# Patient Record
Sex: Female | Born: 1935 | Race: White | Hispanic: No | Smoking: Never smoker
Health system: Southern US, Community
[De-identification: ages and names within clinical notes are randomized; demographics above are authoritative.]

## PROBLEM LIST (undated history)

## (undated) DIAGNOSIS — E785 Hyperlipidemia, unspecified: Secondary | ICD-10-CM

## (undated) DIAGNOSIS — N183 Chronic kidney disease, stage 3 unspecified: Secondary | ICD-10-CM

## (undated) DIAGNOSIS — I10 Essential (primary) hypertension: Secondary | ICD-10-CM

## (undated) DIAGNOSIS — I639 Cerebral infarction, unspecified: Secondary | ICD-10-CM

## (undated) DIAGNOSIS — E119 Type 2 diabetes mellitus without complications: Secondary | ICD-10-CM

## (undated) DIAGNOSIS — I4819 Other persistent atrial fibrillation: Secondary | ICD-10-CM

## (undated) DIAGNOSIS — E039 Hypothyroidism, unspecified: Secondary | ICD-10-CM

## (undated) HISTORY — DX: Chronic kidney disease, stage 3 unspecified: N18.30

## (undated) HISTORY — DX: Type 2 diabetes mellitus without complications: E11.9

## (undated) HISTORY — DX: Chronic kidney disease, stage 3 (moderate): N18.3

## (undated) HISTORY — DX: Cerebral infarction, unspecified: I63.9

## (undated) HISTORY — DX: Other persistent atrial fibrillation: I48.19

## (undated) HISTORY — PX: TONSILLECTOMY AND ADENOIDECTOMY: SHX28

---

## 1968-12-19 HISTORY — PX: BREAST EXCISIONAL BIOPSY: SUR124

## 2004-10-25 ENCOUNTER — Ambulatory Visit: Payer: Self-pay | Admitting: Unknown Physician Specialty

## 2005-07-18 ENCOUNTER — Ambulatory Visit: Payer: Self-pay | Admitting: Gastroenterology

## 2005-11-30 ENCOUNTER — Ambulatory Visit: Payer: Self-pay | Admitting: Unknown Physician Specialty

## 2006-12-28 ENCOUNTER — Ambulatory Visit: Payer: Self-pay | Admitting: Unknown Physician Specialty

## 2008-01-29 ENCOUNTER — Ambulatory Visit: Payer: Self-pay | Admitting: Unknown Physician Specialty

## 2008-08-18 ENCOUNTER — Ambulatory Visit: Payer: Self-pay | Admitting: Unknown Physician Specialty

## 2009-02-05 ENCOUNTER — Ambulatory Visit: Payer: Self-pay | Admitting: Unknown Physician Specialty

## 2010-02-09 ENCOUNTER — Ambulatory Visit: Payer: Self-pay | Admitting: Unknown Physician Specialty

## 2011-02-16 ENCOUNTER — Ambulatory Visit: Payer: Self-pay | Admitting: Unknown Physician Specialty

## 2012-03-27 ENCOUNTER — Ambulatory Visit: Payer: Self-pay | Admitting: Unknown Physician Specialty

## 2013-06-20 ENCOUNTER — Ambulatory Visit: Payer: Self-pay

## 2014-05-26 DIAGNOSIS — E785 Hyperlipidemia, unspecified: Secondary | ICD-10-CM | POA: Insufficient documentation

## 2014-05-26 DIAGNOSIS — I10 Essential (primary) hypertension: Secondary | ICD-10-CM | POA: Insufficient documentation

## 2014-05-26 DIAGNOSIS — R739 Hyperglycemia, unspecified: Secondary | ICD-10-CM | POA: Insufficient documentation

## 2014-05-26 DIAGNOSIS — E039 Hypothyroidism, unspecified: Secondary | ICD-10-CM | POA: Insufficient documentation

## 2014-06-23 ENCOUNTER — Ambulatory Visit: Payer: Self-pay | Admitting: Internal Medicine

## 2014-12-25 ENCOUNTER — Ambulatory Visit: Payer: Self-pay | Admitting: Ophthalmology

## 2014-12-25 LAB — POTASSIUM: Potassium: 3.8 mmol/L (ref 3.5–5.1)

## 2015-01-06 ENCOUNTER — Ambulatory Visit: Payer: Self-pay | Admitting: Ophthalmology

## 2015-02-26 DIAGNOSIS — E7800 Pure hypercholesterolemia, unspecified: Secondary | ICD-10-CM | POA: Insufficient documentation

## 2015-02-26 DIAGNOSIS — F419 Anxiety disorder, unspecified: Secondary | ICD-10-CM | POA: Insufficient documentation

## 2015-04-19 NOTE — Op Note (Signed)
PATIENT NAME:  Jill GainerUSTIN, [name] MR#:  [id number] DATE OF BIRTH:  Feb 01, 1923  DATE OF PROCEDURE:  01/06/2015  PREOPERATIVE DIAGNOSIS:  Nuclear sclerotic cataract of the left eye.   POSTOPERATIVE DIAGNOSIS:  Nuclear sclerotic cataract of the left eye.   OPERATIVE PROCEDURE:  Cataract extraction by phacoemulsification with implant of intraocular lens to left eye.   SURGEON:  Galen ManilaWilliam Makaylen Thieme, MD.   ANESTHESIA:  1. Managed anesthesia care.  2. Topical tetracaine drops followed by 2% Xylocaine jelly applied in the preoperative holding area.   COMPLICATIONS:  As the last bit of cortical material was being removed, a medium-sized rent formed in the posterior capsule. No vitreous presented into the anterior chamber. A sulcus lens was placed without further complication.   TECHNIQUE:   Stop and chop.  DESCRIPTION OF PROCEDURE:  The patient was examined and consented in the preoperative holding area where the aforementioned topical anesthesia was applied to the left eye and then brought back to the Operating Room where the left eye was prepped and draped in the usual sterile ophthalmic fashion and a lid speculum was placed. A paracentesis was created with the side port blade and the anterior chamber was filled with viscoelastic. A near clear corneal incision was performed with the steel keratome. A continuous curvilinear capsulorrhexis was performed with a cystotome followed by the capsulorrhexis forceps. Hydrodissection and hydrodelineation were carried out with BSS on a blunt cannula. The lens was removed in a stop and chop technique and the remaining cortical material was removed with the irrigation-aspiration handpiece. The capsular bag was inflated with viscoelastic and the Alcon MA60AC 21.0-diopter lens, serial number [id number] was placed in the capsular bag without complication. The remaining viscoelastic was removed from the eye with the irrigation-aspiration handpiece. The wounds were hydrated.  The anterior chamber was flushed with Miostat and the eye was inflated to physiologic pressure. 0.1 mL of cefuroxime concentration 10 mg/mL was placed in the anterior chamber. The wounds were found to be water tight. The eye was dressed with Vigamox. The patient was given protective glasses to wear throughout the day and a shield with which to sleep tonight. The patient was also given drops with which to begin a drop regimen today and will follow-up with me in one day.    ____________________________ Jerilee FieldWilliam L. Corey Caulfield, MD wlp:TT D: 01/06/2015 20:52:19 ET T: 01/06/2015 23:05:11 ET JOB#: [id number]  cc: Nagi Furio L. Zipporah Finamore, MD, <Dictator> Jerilee FieldWILLIAM L [place] Dohmen MD ELECTRONICALLY SIGNED 01/07/2015 16:10

## 2015-07-03 ENCOUNTER — Other Ambulatory Visit: Payer: Self-pay | Admitting: Internal Medicine

## 2015-07-03 DIAGNOSIS — Z1231 Encounter for screening mammogram for malignant neoplasm of breast: Secondary | ICD-10-CM

## 2015-07-07 ENCOUNTER — Other Ambulatory Visit: Payer: Self-pay | Admitting: Internal Medicine

## 2015-07-07 ENCOUNTER — Ambulatory Visit
Admission: RE | Admit: 2015-07-07 | Discharge: 2015-07-07 | Disposition: A | Discharge status: Home / Self Care | Payer: Medicare Other | Source: Ambulatory Visit | Attending: Internal Medicine | Admitting: Internal Medicine

## 2015-07-07 DIAGNOSIS — Z1231 Encounter for screening mammogram for malignant neoplasm of breast: Secondary | ICD-10-CM

## 2015-08-27 DIAGNOSIS — R7303 Prediabetes: Secondary | ICD-10-CM | POA: Insufficient documentation

## 2016-04-14 DIAGNOSIS — J302 Other seasonal allergic rhinitis: Secondary | ICD-10-CM | POA: Insufficient documentation

## 2016-06-02 ENCOUNTER — Other Ambulatory Visit: Payer: Self-pay | Admitting: Internal Medicine

## 2016-06-02 DIAGNOSIS — Z1231 Encounter for screening mammogram for malignant neoplasm of breast: Secondary | ICD-10-CM

## 2016-07-28 ENCOUNTER — Other Ambulatory Visit: Payer: Self-pay | Admitting: Internal Medicine

## 2016-07-28 ENCOUNTER — Ambulatory Visit
Admission: RE | Admit: 2016-07-28 | Discharge: 2016-07-28 | Disposition: A | Discharge status: Home / Self Care | Payer: Medicare Other | Source: Ambulatory Visit | Attending: Internal Medicine | Admitting: Internal Medicine

## 2016-07-28 DIAGNOSIS — Z1231 Encounter for screening mammogram for malignant neoplasm of breast: Secondary | ICD-10-CM | POA: Insufficient documentation

## 2017-01-11 DIAGNOSIS — N289 Disorder of kidney and ureter, unspecified: Secondary | ICD-10-CM | POA: Insufficient documentation

## 2017-04-12 DIAGNOSIS — N183 Chronic kidney disease, stage 3 unspecified: Secondary | ICD-10-CM | POA: Insufficient documentation

## 2017-06-23 ENCOUNTER — Other Ambulatory Visit: Payer: Self-pay | Admitting: Internal Medicine

## 2017-06-23 DIAGNOSIS — Z1231 Encounter for screening mammogram for malignant neoplasm of breast: Secondary | ICD-10-CM

## 2017-08-11 ENCOUNTER — Ambulatory Visit
Admission: RE | Admit: 2017-08-11 | Discharge: 2017-08-11 | Disposition: A | Discharge status: Home / Self Care | Payer: Medicare Other | Source: Ambulatory Visit | Attending: Internal Medicine | Admitting: Internal Medicine

## 2017-08-11 DIAGNOSIS — Z1231 Encounter for screening mammogram for malignant neoplasm of breast: Secondary | ICD-10-CM | POA: Insufficient documentation

## 2018-03-16 ENCOUNTER — Ambulatory Visit (INDEPENDENT_AMBULATORY_CARE_PROVIDER_SITE_OTHER): Payer: Medicare Other

## 2018-03-16 ENCOUNTER — Ambulatory Visit (INDEPENDENT_AMBULATORY_CARE_PROVIDER_SITE_OTHER): Payer: Medicare Other | Admitting: Podiatry

## 2018-03-16 DIAGNOSIS — M659 Synovitis and tenosynovitis, unspecified: Secondary | ICD-10-CM | POA: Diagnosis not present

## 2018-03-16 DIAGNOSIS — M779 Enthesopathy, unspecified: Secondary | ICD-10-CM

## 2018-03-16 DIAGNOSIS — M7751 Other enthesopathy of right foot: Secondary | ICD-10-CM

## 2018-03-21 NOTE — Progress Notes (Signed)
   Subjective:  84 year old female presenting today as a new patient with a chief complaint of intermittent sharp, pulling pain of the lateral and medial sides of the right foot that has been ongoing for several years. Walking and standing increase the pain. She has not done anything for treatment. Patient is here for further evaluation and treatment.   No past medical history on file.    Objective / Physical Exam:  General:  The patient is alert and oriented x3 in no acute distress. Dermatology:  Skin is warm, dry and supple bilateral lower extremities. Negative for open lesions or macerations. Vascular:  Palpable pedal pulses bilaterally. No edema or erythema noted. Capillary refill within normal limits. Neurological:  Epicritic and protective threshold grossly intact bilaterally.  Musculoskeletal Exam:  Pain on palpation to the anterior lateral medial aspects of the patient's right ankle as well as the right midfoot. Mild edema noted. Range of motion within normal limits to all pedal and ankle joints bilateral. Muscle strength 5/5 in all groups bilateral.   Radiographic Exam:  Degenerative changes to the joints of the foot consistent with DJD.     Assessment: #1 pain in right ankle #2 synovitis of right ankle - lateral  #3 right midfoot OA  Plan of Care:  #1 Patient was evaluated. X-Rays reviewed.  #2 injection of 0.5 mL Celestone Soluspan injected in the patient's right ankle. #3 Recommended OTC insoles from the [employer] in [place].  #4 Patient may benefit from custom molded orthotics in the future.  #5 Recommended OTC Aleve as needed.  #6 Return to clinic as needed.    Felecia ShellingBrent M. Kimmi Acocella, DPM [facility]  Dr. Felecia ShellingBrent M. Needham Biggins, DPM    [address]                                        [place], [place] [address]                Office [phone]) [phone]  Fax [phone]

## 2018-04-17 DIAGNOSIS — E538 Deficiency of other specified B group vitamins: Secondary | ICD-10-CM | POA: Insufficient documentation

## 2018-06-13 ENCOUNTER — Telehealth: Payer: Self-pay | Admitting: Podiatry

## 2018-06-13 NOTE — Telephone Encounter (Signed)
I called pt and spoke with Dtr, Debarah Crapelaudia and informed PowerSteps could be purchased at [employer].

## 2018-06-13 NOTE — Telephone Encounter (Signed)
Patient wants to know where she can go to get inserts. She knows we do it in house but Dr Logan BoresEvans stated she can go somewhere else in [place] to get them cheaper and she cant remember where he was talking about. Please call patient back at [phone]

## 2018-07-10 ENCOUNTER — Other Ambulatory Visit: Payer: Self-pay | Admitting: Internal Medicine

## 2018-07-10 DIAGNOSIS — Z1231 Encounter for screening mammogram for malignant neoplasm of breast: Secondary | ICD-10-CM

## 2018-08-31 ENCOUNTER — Ambulatory Visit
Admission: RE | Admit: 2018-08-31 | Discharge: 2018-08-31 | Disposition: A | Discharge status: Home / Self Care | Payer: Medicare Other | Source: Ambulatory Visit | Attending: Internal Medicine | Admitting: Internal Medicine

## 2018-08-31 DIAGNOSIS — Z1231 Encounter for screening mammogram for malignant neoplasm of breast: Secondary | ICD-10-CM | POA: Diagnosis not present

## 2018-09-11 ENCOUNTER — Emergency Department: Payer: Medicare Other

## 2018-09-11 ENCOUNTER — Observation Stay
Admission: EM | Admit: 2018-09-11 | Discharge: 2018-09-12 | Disposition: A | Discharge status: Home / Self Care | Payer: Medicare Other | Attending: Internal Medicine | Admitting: Internal Medicine

## 2018-09-11 ENCOUNTER — Other Ambulatory Visit: Payer: Self-pay

## 2018-09-11 ENCOUNTER — Inpatient Hospital Stay: Payer: Medicare Other

## 2018-09-11 ENCOUNTER — Encounter: Payer: Self-pay | Admitting: Emergency Medicine

## 2018-09-11 ENCOUNTER — Inpatient Hospital Stay
Admit: 2018-09-11 | Discharge: 2018-09-11 | Disposition: A | Discharge status: Home / Self Care | Payer: Medicare Other | Attending: Internal Medicine | Admitting: Internal Medicine

## 2018-09-11 DIAGNOSIS — E876 Hypokalemia: Secondary | ICD-10-CM | POA: Diagnosis not present

## 2018-09-11 DIAGNOSIS — Z7951 Long term (current) use of inhaled steroids: Secondary | ICD-10-CM | POA: Insufficient documentation

## 2018-09-11 DIAGNOSIS — R29702 NIHSS score 2: Secondary | ICD-10-CM | POA: Diagnosis not present

## 2018-09-11 DIAGNOSIS — F419 Anxiety disorder, unspecified: Secondary | ICD-10-CM | POA: Diagnosis not present

## 2018-09-11 DIAGNOSIS — Z886 Allergy status to analgesic agent status: Secondary | ICD-10-CM | POA: Diagnosis not present

## 2018-09-11 DIAGNOSIS — E785 Hyperlipidemia, unspecified: Secondary | ICD-10-CM | POA: Insufficient documentation

## 2018-09-11 DIAGNOSIS — I639 Cerebral infarction, unspecified: Secondary | ICD-10-CM | POA: Diagnosis present

## 2018-09-11 DIAGNOSIS — I4891 Unspecified atrial fibrillation: Secondary | ICD-10-CM | POA: Insufficient documentation

## 2018-09-11 DIAGNOSIS — R531 Weakness: Secondary | ICD-10-CM | POA: Diagnosis not present

## 2018-09-11 DIAGNOSIS — Z880 Allergy status to penicillin: Secondary | ICD-10-CM | POA: Diagnosis not present

## 2018-09-11 DIAGNOSIS — Z8249 Family history of ischemic heart disease and other diseases of the circulatory system: Secondary | ICD-10-CM | POA: Insufficient documentation

## 2018-09-11 DIAGNOSIS — E039 Hypothyroidism, unspecified: Secondary | ICD-10-CM | POA: Insufficient documentation

## 2018-09-11 DIAGNOSIS — R4701 Aphasia: Secondary | ICD-10-CM | POA: Insufficient documentation

## 2018-09-11 DIAGNOSIS — Z9849 Cataract extraction status, unspecified eye: Secondary | ICD-10-CM | POA: Diagnosis not present

## 2018-09-11 DIAGNOSIS — Z79899 Other long term (current) drug therapy: Secondary | ICD-10-CM | POA: Diagnosis not present

## 2018-09-11 DIAGNOSIS — N183 Chronic kidney disease, stage 3 (moderate): Secondary | ICD-10-CM | POA: Insufficient documentation

## 2018-09-11 DIAGNOSIS — Z885 Allergy status to narcotic agent status: Secondary | ICD-10-CM | POA: Insufficient documentation

## 2018-09-11 DIAGNOSIS — E1122 Type 2 diabetes mellitus with diabetic chronic kidney disease: Secondary | ICD-10-CM | POA: Diagnosis not present

## 2018-09-11 DIAGNOSIS — Z79890 Hormone replacement therapy (postmenopausal): Secondary | ICD-10-CM | POA: Insufficient documentation

## 2018-09-11 DIAGNOSIS — I129 Hypertensive chronic kidney disease with stage 1 through stage 4 chronic kidney disease, or unspecified chronic kidney disease: Secondary | ICD-10-CM | POA: Insufficient documentation

## 2018-09-11 HISTORY — DX: Hypothyroidism, unspecified: E03.9

## 2018-09-11 HISTORY — DX: Essential (primary) hypertension: I10

## 2018-09-11 HISTORY — DX: Hyperlipidemia, unspecified: E78.5

## 2018-09-11 LAB — DIFFERENTIAL
BASOS ABS: 0.1 10*3/uL (ref 0–0.1)
Basophils Relative: 1 %
EOS ABS: 0.1 10*3/uL (ref 0–0.7)
Eosinophils Relative: 1 %
LYMPHS ABS: 1.3 10*3/uL (ref 1.0–3.6)
Lymphocytes Relative: 15 %
MONOS PCT: 6 %
Monocytes Absolute: 0.5 10*3/uL (ref 0.2–0.9)
Neutro Abs: 7 10*3/uL — ABNORMAL HIGH (ref 1.4–6.5)
Neutrophils Relative %: 77 %

## 2018-09-11 LAB — PROTIME-INR
INR: 0.92
Prothrombin Time: 12.3 seconds (ref 11.4–15.2)

## 2018-09-11 LAB — COMPREHENSIVE METABOLIC PANEL
ALT: 14 U/L (ref 0–44)
AST: 25 U/L (ref 15–41)
Albumin: 4.2 g/dL (ref 3.5–5.0)
Alkaline Phosphatase: 59 U/L (ref 38–126)
Anion gap: 10 (ref 5–15)
BILIRUBIN TOTAL: 0.6 mg/dL (ref 0.3–1.2)
BUN: 15 mg/dL (ref 8–23)
CO2: 26 mmol/L (ref 22–32)
CREATININE: 0.91 mg/dL (ref 0.44–1.00)
Calcium: 9.3 mg/dL (ref 8.9–10.3)
Chloride: 101 mmol/L (ref 98–111)
GFR calc Af Amer: >60 mL/min (ref >60)
GFR calc non Af Amer: 53 mL/min — ABNORMAL LOW (ref >60)
GLUCOSE: 207 mg/dL — AB (ref 70–99)
POTASSIUM: 3.5 mmol/L (ref 3.5–5.1)
Sodium: 137 mmol/L (ref 135–145)
Total Protein: 7.6 g/dL (ref 6.5–8.1)

## 2018-09-11 LAB — CBC
HEMATOCRIT: 41.3 % (ref 35.0–47.0)
HEMOGLOBIN: 14.4 g/dL (ref 12.0–16.0)
MCH: 32.6 pg (ref 26.0–34.0)
MCHC: 34.9 g/dL (ref 32.0–36.0)
MCV: 93.4 fL (ref 80.0–100.0)
Platelets: 393 10*3/uL (ref 150–440)
RBC: 4.42 MIL/uL (ref 3.80–5.20)
RDW: 13.2 % (ref 11.5–14.5)
WBC: 9 10*3/uL (ref 3.6–11.0)

## 2018-09-11 LAB — GLUCOSE, CAPILLARY: GLUCOSE-CAPILLARY: 180 mg/dL — AB (ref 70–99)

## 2018-09-11 LAB — HEMOGLOBIN A1C
Hgb A1c MFr Bld: 6.5 % — ABNORMAL HIGH (ref 4.8–5.6)
Mean Plasma Glucose: 139.85 mg/dL

## 2018-09-11 LAB — APTT: APTT: 32 s (ref 24–36)

## 2018-09-11 LAB — TROPONIN I: Troponin I: <0.03 ng/mL (ref <0.03)

## 2018-09-11 MED ORDER — CALCIUM CITRATE-VITAMIN D 500-500 MG-UNIT PO CHEW
2.0000 | CHEWABLE_TABLET | Freq: Every day | ORAL | Status: DC
  Administered 2018-09-12: 2 via ORAL
  Filled 2018-09-11: qty 2

## 2018-09-11 MED ORDER — POTASSIUM CHLORIDE CRYS ER 20 MEQ PO TBCR
40.0000 meq | EXTENDED_RELEASE_TABLET | Freq: Once | ORAL | Status: AC
  Administered 2018-09-11: 18:00:00 40 meq via ORAL
  Filled 2018-09-11: qty 2

## 2018-09-11 MED ORDER — LORATADINE 10 MG PO TABS
10.0000 mg | ORAL_TABLET | Freq: Every day | ORAL | Status: DC
  Administered 2018-09-12: 10:00:00 10 mg via ORAL
  Filled 2018-09-11: qty 1

## 2018-09-11 MED ORDER — ASPIRIN EC 325 MG PO TBEC
325.0000 mg | DELAYED_RELEASE_TABLET | Freq: Every day | ORAL | Status: DC
  Administered 2018-09-12: 325 mg via ORAL
  Filled 2018-09-11: qty 1

## 2018-09-11 MED ORDER — FLUTICASONE PROPIONATE 50 MCG/ACT NA SUSP
2.0000 | Freq: Every day | NASAL | Status: DC
  Administered 2018-09-12: 10:00:00 2 via NASAL
  Filled 2018-09-11: qty 16

## 2018-09-11 MED ORDER — LEVOTHYROXINE SODIUM 50 MCG PO TABS
75.0000 ug | ORAL_TABLET | Freq: Every day | ORAL | Status: DC
  Administered 2018-09-12: 11:00:00 75 ug via ORAL
  Filled 2018-09-11: qty 1

## 2018-09-11 MED ORDER — MAGNESIUM SULFATE 2 GM/50ML IV SOLN
2.0000 g | Freq: Once | INTRAVENOUS | Status: AC
  Administered 2018-09-11: 18:00:00 2 g via INTRAVENOUS
  Filled 2018-09-11: qty 50

## 2018-09-11 MED ORDER — ENOXAPARIN SODIUM 40 MG/0.4ML ~~LOC~~ SOLN
40.0000 mg | SUBCUTANEOUS | Status: DC
  Administered 2018-09-11: 22:00:00 40 mg via SUBCUTANEOUS
  Filled 2018-09-11: qty 0.4

## 2018-09-11 MED ORDER — STROKE: EARLY STAGES OF RECOVERY BOOK
Freq: Once | Status: AC
  Administered 2018-09-11: 18:00:00

## 2018-09-11 MED ORDER — ASPIRIN 81 MG PO CHEW
324.0000 mg | CHEWABLE_TABLET | Freq: Once | ORAL | Status: AC
  Administered 2018-09-11: 324 mg via ORAL
  Filled 2018-09-11: qty 4

## 2018-09-11 MED ORDER — ROSUVASTATIN CALCIUM 20 MG PO TABS
20.0000 mg | ORAL_TABLET | Freq: Every day | ORAL | Status: DC
  Administered 2018-09-11 – 2018-09-12 (×2): 20 mg via ORAL
  Filled 2018-09-11 (×2): qty 1

## 2018-09-11 MED ORDER — BISOPROLOL-HYDROCHLOROTHIAZIDE 2.5-6.25 MG PO TABS
1.0000 | ORAL_TABLET | Freq: Every day | ORAL | Status: DC
  Administered 2018-09-12: 1 via ORAL
  Filled 2018-09-11: qty 1

## 2018-09-11 MED ORDER — LORAZEPAM 0.5 MG PO TABS: 0.5000 mg | ORAL_TABLET | Freq: Every evening | ORAL | Status: DC | PRN

## 2018-09-11 MED ORDER — IOHEXOL 350 MG/ML SOLN
100.0000 mL | Freq: Once | INTRAVENOUS | Status: AC | PRN
  Administered 2018-09-11: 100 mL via INTRAVENOUS

## 2018-09-11 NOTE — ED Notes (Signed)
Pt ambulatory to and from bathroom with assist of RN. Pt ambulates with a steady gait. ABCs intact. NAD.

## 2018-09-11 NOTE — ED Notes (Signed)
Pt taken to CT. ABCs intact. NAD. 

## 2018-09-11 NOTE — ED Notes (Signed)
Pt to floor with Dorian EDT on cardiac monitor. At time of leaving ED, pt alert & oriented x3. ABCs intact. VSS. NAD.

## 2018-09-11 NOTE — Consult Note (Signed)
Referring Physician: Scotty CourtStafford    Chief Complaint: Confusion  HPI: Jill Mitchell is an 84 y.o. female who was at baseline last evening when her daughter spoke with her.  They spoke this morning again and the patient seemed confused.  Could not operate the microwave.  Speech did not make sense.  Patient was brought in for evaluation at that time.  Initial NIHSS of 2. At baseline patient lives alone and performs all ADL's independently.    Date last known well: Date: 09/10/2018 Time last known well: Time: 19:00 tPA Given: No: Outside time window  Past Medical History:  Diagnosis Date  . Hyperlipidemia   . Hypertension   . Hypothyroid     Past Surgical History:  Procedure Laterality Date  . BREAST EXCISIONAL BIOPSY Bilateral 1970   benign    Family History  Problem Relation Age of Onset  . Breast cancer Sister [id number]  . Breast cancer Paternal Aunt 6260   Social History:  reports that she has never smoked. She does not have any smokeless tobacco history on file. She reports that she drank alcohol. She reports that she does not use drugs.  Allergies:  Allergies  Allergen Reactions  . Aspirin Other (See Comments)    Causes bruising  . Atorvastatin Nausea Only  . Cholestyramine Nausea Only  . Codeine Other (See Comments)  . Lovastatin Nausea Only    GI upset  . Niacin Other (See Comments)    ineffective  . Penicillins Other (See Comments)  . Pravastatin Nausea Only  . Ramipril Other (See Comments)  . Simvastatin Nausea Only    Medications: I have reviewed the patient's current medications. Prior to Admission:  Prior to Admission medications   Medication Sig Start Date End Date Taking? Authorizing Provider  amLODipine (NORVASC) 10 MG tablet Take 10 mg by mouth daily.  01/11/17  Yes [provider]  bisoprolol-hydrochlorothiazide (ZIAC) 2.5-6.25 MG tablet Take 1 tablet by mouth daily.  03/06/18 03/06/19 Yes [provider]  calcium citrate-vitamin D  (CITRACAL+D) 315-200 MG-UNIT tablet Take 2 tablets by mouth daily.    Yes [provider]  cyanocobalamin (,VITAMIN B-12,) 1000 MCG/ML injection Inject 1,000 mcg into the muscle every 30 (thirty) days.   Yes [provider]  fluticasone (FLONASE) 50 MCG/ACT nasal spray Place 2 sprays into both nostrils daily.   Yes [provider]  levothyroxine (SYNTHROID, LEVOTHROID) 75 MCG tablet Take 75 mcg by mouth daily.  01/11/17  Yes [provider]  loratadine (CLARITIN) 10 MG tablet Take 10 mg by mouth daily.   Yes [provider]  LORazepam (ATIVAN) 1 MG tablet Take 0.5 mg by mouth at bedtime as needed for anxiety.  05/22/17  Yes [provider]  rosuvastatin (CRESTOR) 10 MG tablet Take 10 mg by mouth daily.  10/21/16  Yes [provider]    ROS: History obtained from the patient  General ROS: negative for - chills, fatigue, fever, night sweats, weight gain or weight loss Psychological ROS: negative for - behavioral disorder, hallucinations, memory difficulties, mood swings or suicidal ideation Ophthalmic ROS: negative for - blurry vision, double vision, eye pain or loss of vision ENT ROS: negative for - epistaxis, nasal discharge, oral lesions, sore throat, tinnitus or vertigo Allergy and Immunology ROS: negative for - hives or itchy/watery eyes Hematological and Lymphatic ROS: negative for - bleeding problems, bruising or swollen lymph nodes Endocrine ROS: negative for - galactorrhea, hair pattern changes, polydipsia/polyuria or temperature intolerance Respiratory ROS: negative for -  cough, hemoptysis, shortness of breath or wheezing Cardiovascular ROS: negative for - chest pain, dyspnea on exertion, edema or irregular heartbeat Gastrointestinal ROS: negative for - abdominal pain, diarrhea, hematemesis, nausea/vomiting or stool incontinence Genito-Urinary ROS: negative for - dysuria, hematuria, incontinence or urinary  frequency/urgency Musculoskeletal ROS: negative for - joint swelling or muscular weakness Neurological ROS: as noted in HPI Dermatological ROS: negative for rash and skin lesion changes  Physical Examination: Blood pressure (!) 154/78, pulse 93, temperature 98.2 F (36.8 C), temperature source Oral, resp. rate 15, height 5\' 6"  (1.676 m), weight 56.2 kg, SpO2 96 %.  HEENT-  Normocephalic, no lesions, without obvious abnormality.  Normal external eye and conjunctiva.  Normal TM's bilaterally.  Normal auditory canals and external ears. Normal external nose, mucus membranes and septum.  Normal pharynx. Cardiovascular- S1, S2 normal, pulses palpable throughout   Lungs- chest clear, no wheezing, rales, normal symmetric air entry Abdomen- soft, non-tender; bowel sounds normal; no masses,  no organomegaly Extremities- no edema Lymph-no adenopathy palpable Musculoskeletal-no joint tenderness, deformity or swelling Skin-warm and dry, no hyperpigmentation, vitiligo, or suspicious lesions  Neurological Examination   Mental Status: Alert.  Some expressive aphasia noted with multiple paraphasic errors.  Comprehension appears intact. Able to follow commands without difficulty. Cranial Nerves: II: Discs flat bilaterally; Visual fields grossly normal, pupils equal, round, reactive to light and accommodation III,IV, VI: ptosis not present, extra-ocular motions intact bilaterally V,VII: smile symmetric, facial light touch sensation normal bilaterally VIII: hearing normal bilaterally IX,X: gag reflex present XI: bilateral shoulder shrug XII: midline tongue extension Motor: Right : Upper extremity   5/5    Left:     Upper extremity   5/5  Lower extremity   5/5     Lower extremity   5/5 Tone and bulk:normal tone throughout; no atrophy noted Sensory: Pinprick and light touch intact throughout, bilaterally Deep Tendon Reflexes: 2+ and symmetric with absent AJ's bilaterally Plantars: Right: mute   Left:  mute Cerebellar: Normal finger-to-nose and normal heel-to-shin testing bilaterally Gait: not tested due to safety concerns    Laboratory Studies:  Basic Metabolic Panel: No results for input(s): NA, K, CL, CO2, GLUCOSE, BUN, CREATININE, CALCIUM, MG, PHOS in the last 168 hours.  Liver Function Tests: No results for input(s): AST, ALT, ALKPHOS, BILITOT, PROT, ALBUMIN in the last 168 hours. No results for input(s): LIPASE, AMYLASE in the last 168 hours. No results for input(s): AMMONIA in the last 168 hours.  CBC: Recent Labs  Lab 09/11/18 1159  WBC 9.0  NEUTROABS 7.0*  HGB 14.4  HCT 41.3  MCV 93.4  PLT 393    Cardiac Enzymes: No results for input(s): CKTOTAL, CKMB, CKMBINDEX, TROPONINI in the last 168 hours.  BNP: Invalid input(s): POCBNP  CBG: Recent Labs  Lab 09/11/18 1227  GLUCAP 180*    Microbiology: No results found for this or any previous visit.  Coagulation Studies: Recent Labs    09/11/18 1159  LABPROT 12.3  INR 0.92    Urinalysis: No results for input(s): COLORURINE, LABSPEC, PHURINE, GLUCOSEU, HGBUR, BILIRUBINUR, KETONESUR, PROTEINUR, UROBILINOGEN, NITRITE, LEUKOCYTESUR in the last 168 hours.  Invalid input(s): APPERANCEUR  Lipid Panel: No results found for: CHOL, TRIG, HDL, CHOLHDL, VLDL, LDLCALC  HgbA1C: No results found for: HGBA1C  Urine Drug Screen:  No results found for: LABOPIA, COCAINSCRNUR, LABBENZ, AMPHETMU, THCU, LABBARB  Alcohol Level: No results for input(s): ETH in the last 168 hours.  Other results: EKG: atrial fibrillation, rate 88 bpm.  Imaging: Ct Head Wo  Contrast  Result Date: 09/11/2018 CLINICAL DATA:  Confusion and dysarthria EXAM: CT HEAD WITHOUT CONTRAST TECHNIQUE: Contiguous axial images were obtained from the base of the skull through the vertex without intravenous contrast. COMPARISON:  None. FINDINGS: Brain: There is mild diffuse atrophy. There is no intracranial mass, hemorrhage, extra-axial fluid collection, or  midline shift. There is focal decreased attenuation with appearance sulcal effacement at the junction of the left superficial posterior temporal, superficial lateral occipital, and inferior parietal lobes. This area is felt to represent a likely acute infarct. Elsewhere there is relatively mild patchy small vessel disease in the centra semiovale bilaterally. Vascular: There is a subtle hyperdense vessel involving a portion of the M2 segment of the left middle cerebral artery. There is no appreciable vascular calcification. Skull: Bony calvarium appears intact. Sinuses/Orbits: There is mucosal thickening in several ethmoid air cells. Other visualized paranasal sinuses are clear. Visualized orbits appear symmetric bilaterally. Evidence of previous cataract removal on the left. Other: Mastoid air cells are clear. IMPRESSION: 1. Evidence of acute infarct at the junction of the left occipital, temporal, and parietal lobes. In this area, there is focal decreased attenuation in localized sulcal effacement. 2. Area hyperdense vessel involving a portion of the M2 segment of the left middle cerebral artery. 3. Atrophy with mild periventricular small vessel disease. No hemorrhage. 4.  Mucosal thickening in several ethmoid air cells. These results were called by telephone at the time of interpretation on 09/11/2018 at 12:11 pm to Dr. Sharman Cheek , who verbally acknowledged these results. Electronically Signed   By: Bretta Bang III M.D.   On: 09/11/2018 12:12    Assessment: 84 y.o. female presenting with aphasia.  Head CT reviewed and shows an area of hypodensity in the left parieto-occipital junction concerning for infarct.  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  Etiology likely embolic with patient showing evidence of new onset afib on EKG.   Patient on no antiplatelet or anticoagulation prior to  admission.  Further work up recommended.    Stroke Risk Factors - atrial fibrillation, hyperlipidemia and hypertension  Plan: 1. HgbA1c, fasting lipid panel 2. MRI of the brain without contrast 3. PT consult, OT consult, Speech consult 4. Echocardiogram 5. Prophylactic therapy-ASA 300mg  rectally until passing of swallow screen when may take 325mg  daily po.  After 5-7 days would start anticoagulation with Eliquis.   6. NPO until RN stroke swallow screen 8. Telemetry monitoring 9. Frequent neuro checks   Thana Farr, MD Neurology [phone] 09/11/2018, 12:42 PM

## 2018-09-11 NOTE — ED Notes (Signed)
[place] at bedside.

## 2018-09-11 NOTE — ED Triage Notes (Signed)
Per patient's daughter, patient called her this morning and stated "something's not right". Patient lives alone and A&O x4 at baseline. Patient confused and having trouble finding words upon arrival to ED. Last known well at 7pm last night. No weakness noted.

## 2018-09-11 NOTE — Progress Notes (Signed)
CODE STROKE- PHARMACY COMMUNICATION  Time CODE STROKE called/page received:_0   Time response to CODE STROKE was made (in person or via phone): @ 1230 in person   Time Stroke Kit retrieved from [facility] (only if needed):N/A  Name of Provider/Nurse contacted:Olivia   Past Medical History:  Diagnosis Date  . Hyperlipidemia   . Hypertension   . Hypothyroid    Prior to Admission medications   Medication Sig Start Date End Date Taking? Authorizing Provider  amLODipine (NORVASC) 10 MG tablet Take 10 mg by mouth daily.  01/11/17  Yes [provider]  bisoprolol-hydrochlorothiazide (ZIAC) 2.5-6.25 MG tablet Take 1 tablet by mouth daily.  03/06/18 03/06/19 Yes [provider]  calcium citrate-vitamin D (CITRACAL+D) 315-200 MG-UNIT tablet Take 2 tablets by mouth daily.    Yes [provider]  cyanocobalamin (,VITAMIN B-12,) 1000 MCG/ML injection Inject 1,000 mcg into the muscle every 30 (thirty) days.   Yes [provider]  fluticasone (FLONASE) 50 MCG/ACT nasal spray Place 2 sprays into both nostrils daily.   Yes [provider]  levothyroxine (SYNTHROID, LEVOTHROID) 75 MCG tablet Take 75 mcg by mouth daily.  01/11/17  Yes [provider]  loratadine (CLARITIN) 10 MG tablet Take 10 mg by mouth daily.   Yes [provider]  LORazepam (ATIVAN) 1 MG tablet Take 0.5 mg by mouth at bedtime as needed for anxiety.  05/22/17  Yes [provider]  rosuvastatin (CRESTOR) 10 MG tablet Take 10 mg by mouth daily.  10/21/16  Yes [provider]  Calcium Carb-Cholecalciferol (CALCIUM 1000 + D PO) Take by mouth.  09/11/18  [provider]    Pernell Dupre, PharmD, BCPS Clinical Pharmacist 09/11/2018 12:45 PM

## 2018-09-11 NOTE — ED Notes (Signed)
Pt returned from CT. ABCS intact. NAD. Scotty CourtStafford MD at bedside

## 2018-09-11 NOTE — H&P (Signed)
[facility] [place] [facility]   PATIENT NAME: Jill Mitchell    MR#:  [id number]  DATE OF BIRTH:  1923/02/22  DATE OF ADMISSION:  09/11/2018  PRIMARY CARE PHYSICIAN: Leotis Shames, MD   REQUESTING/REFERRING PHYSICIAN: Dr Marga Hoots  CHIEF COMPLAINT:   Chief Complaint  Patient presents with  . Altered Mental Status    HISTORY OF PRESENT ILLNESS:  Jill Mitchell  is a 84 y.o. female brought in with not knowing where she was and not acting right.  She stated she could not do the microwave correctly this morning.  She called her daughter and could not get out the words and her words were slurring.  She lives alone.  Normally walks without a problem.  She states that the she does not feel weak one side versus the other.  In the ER, she was found to have an acute stroke in the left parietal region.  Patient woke up with the symptoms.  Currently 2:30 in the afternoon.  Not a candidate for TPA at this point.  Hospitalist services contacted for further evaluation.  On the monitor patient is in atrial fibrillation which is a new diagnosis for the patient.  PAST MEDICAL HISTORY:   Past Medical History:  Diagnosis Date  . Hyperlipidemia   . Hypertension   . Hypothyroid     PAST SURGICAL HISTORY:   Past Surgical History:  Procedure Laterality Date  . BREAST EXCISIONAL BIOPSY Bilateral 1970   benign  . TONSILLECTOMY AND ADENOIDECTOMY      SOCIAL HISTORY:   Social History   Tobacco Use  . Smoking status: Never Smoker  . Smokeless tobacco: Never Used  Substance Use Topics  . Alcohol use: Not Currently    FAMILY HISTORY:   Family History  Problem Relation Age of Onset  . Breast cancer Sister 12  . Breast cancer Paternal Aunt 16  . CAD Mother     DRUG ALLERGIES:   Allergies  Allergen Reactions  . Aspirin Other (See Comments)    Causes bruising  . Atorvastatin Nausea Only  . Cholestyramine Nausea Only  . Codeine Other (See  Comments)  . Lovastatin Nausea Only    GI upset  . Niacin Other (See Comments)    ineffective  . Penicillins Other (See Comments)  . Pravastatin Nausea Only  . Ramipril Other (See Comments)  . Simvastatin Nausea Only    REVIEW OF SYSTEMS:  CONSTITUTIONAL: No fever, fatigue or weakness.  EYES: No blurred or double vision.  EARS, NOSE, AND THROAT: No tinnitus or ear pain. No sore throat.  Positive for postnasal drip.  Positive for decreased hearing RESPIRATORY: No cough, shortness of breath, wheezing or hemoptysis.  CARDIOVASCULAR: No chest pain, orthopnea, edema.  GASTROINTESTINAL: No nausea, vomiting, diarrhea or abdominal pain. No blood in bowel movements GENITOURINARY: No dysuria, hematuria.  ENDOCRINE: No polyuria, nocturia.  Positive for hypothyroidism HEMATOLOGY: No anemia, easy bruising or bleeding SKIN: No rash or lesion. MUSCULOSKELETAL: No joint pain or arthritis.   NEUROLOGIC: No tingling, numbness, weakness.  PSYCHIATRY: No anxiety or depression.   MEDICATIONS AT HOME:   Prior to Admission medications   Medication Sig Start Date End Date Taking? Authorizing Provider  amLODipine (NORVASC) 10 MG tablet Take 10 mg by mouth daily.  01/11/17  Yes [provider]  bisoprolol-hydrochlorothiazide (ZIAC) 2.5-6.25 MG tablet Take 1 tablet by mouth daily.  03/06/18 03/06/19 Yes [provider]  calcium citrate-vitamin D (CITRACAL+D) 315-200 MG-UNIT tablet Take 2 tablets  by mouth daily.    Yes [provider]  cyanocobalamin (,VITAMIN B-12,) 1000 MCG/ML injection Inject 1,000 mcg into the muscle every 30 (thirty) days.   Yes [provider]  fluticasone (FLONASE) 50 MCG/ACT nasal spray Place 2 sprays into both nostrils daily.   Yes [provider]  levothyroxine (SYNTHROID, LEVOTHROID) 75 MCG tablet Take 75 mcg by mouth daily.  01/11/17  Yes [provider]  loratadine (CLARITIN) 10 MG tablet Take 10 mg by mouth daily.   Yes  [provider]  LORazepam (ATIVAN) 1 MG tablet Take 0.5 mg by mouth at bedtime as needed for anxiety.  05/22/17  Yes [provider]  rosuvastatin (CRESTOR) 10 MG tablet Take 10 mg by mouth daily.  10/21/16  Yes [provider]      VITAL SIGNS:  Blood pressure (!) 153/70, pulse (!) 51, temperature 98.2 F (36.8 C), temperature source Oral, resp. rate 18, height 5\' 6"  (1.676 m), weight 56.2 kg, SpO2 95 %. Heart rate on the monitor 88 PHYSICAL EXAMINATION:  GENERAL:  84 y.o.-year-old patient lying in the bed with no acute distress.  EYES: Pupils equal, round, reactive to light and accommodation. No scleral icterus. Extraocular muscles intact.  HEENT: Head atraumatic, normocephalic. Oropharynx and nasopharynx clear.  NECK:  Supple, no jugular venous distention. No thyroid enlargement, no tenderness.  LUNGS: Normal breath sounds bilaterally, no wheezing, rales,rhonchi or crepitation. No use of accessory muscles of respiration.  CARDIOVASCULAR: S1, S2 irregular regular. No murmurs, rubs, or gallops.  ABDOMEN: Soft, nontender, nondistended. Bowel sounds present. No organomegaly or mass.  EXTREMITIES: No pedal edema, cyanosis, or clubbing.  NEUROLOGIC: Patient has impaired hearing.  Other cranial nerves grossly intact. Muscle strength 5/5 in all extremities. Sensation intact. Gait not checked.  PSYCHIATRIC: The patient is alert and oriented x 3.  SKIN: No rash, lesion, or ulcer.   LABORATORY PANEL:   CBC Recent Labs  Lab 09/11/18 1159  WBC 9.0  HGB 14.4  HCT 41.3  PLT 393   ------------------------------------------------------------------------------------------------------------------  Chemistries  Recent Labs  Lab 09/11/18 1159  NA 137  K 3.5  CL 101  CO2 26  GLUCOSE 207*  BUN 15  CREATININE 0.91  CALCIUM 9.3  AST 25  ALT 14  ALKPHOS 59  BILITOT 0.6    ------------------------------------------------------------------------------------------------------------------  Cardiac Enzymes Recent Labs  Lab 09/11/18 1159  TROPONINI <0.03   ------------------------------------------------------------------------------------------------------------------  RADIOLOGY:  Ct Angio Head W Or Wo Contrast  Result Date: 09/11/2018 CLINICAL DATA:  Confusion and altered mental status. Trouble finding words. EXAM: CT ANGIOGRAPHY HEAD AND NECK CT PERFUSION BRAIN TECHNIQUE: Multidetector CT imaging of the head and neck was performed using the standard protocol during bolus administration of intravenous contrast. Multiplanar CT image reconstructions and MIPs were obtained to evaluate the vascular anatomy. Carotid stenosis measurements (when applicable) are obtained utilizing NASCET criteria, using the distal internal carotid diameter as the denominator. Multiphase CT imaging of the brain was performed following IV bolus contrast injection. Subsequent parametric perfusion maps were calculated using RAPID software. CONTRAST:  OMNIPAQUE IOHEXOL 350 MG/ML SOLN COMPARISON:  Head CT from earlier today FINDINGS: CTA NECK FINDINGS Aortic arch: Atherosclerotic plaque. Three vessel branching. No aneurysm or dissection. Right carotid system: Calcified plaque at the right ICA bulb with 50% stenosis on coronal reformats. No ulceration. No beading. Left carotid system: Moderate calcified plaque at the common carotid bifurcation without stenosis or ulceration. ICA tortuosity. Vertebral arteries: Proximal subclavian atherosclerosis without flow limiting stenosis. There is  plaque at the origin of the right vertebral artery with mild stenosis. Tortuous vertebral arteries without evidence of dissection or beading. Skeleton: Advanced degenerative disease. Other neck: No evidence of mass or inflammation. Upper chest: No acute finding. Review of the MIP images confirms the above findings  CTA HEAD FINDINGS Anterior circulation: Left M4 branch occlusion marked on series 7. The level of occlusion correlates with the size of infarct by CT. There is atherosclerotic plaque on the carotid siphons. Wide necked superior hypophyseal region aneurysm from the ICA on the left measuring 4 x 3 mm. Posterior circulation: Symmetric vertebral arteries. The vertebral and basilar arteries are smooth and widely patent. No branch occlusion or beading. Venous sinuses: Patent Anatomic variants: None significant Delayed phase: Not obtained in the emergent setting Review of the MIP images confirms the above findings CT Brain Perfusion Findings: CBF (<30%) Volume: 330mL-but infarct is present infarct in the left parietal region by CT. Perfusion (Tmax>6.0s) volume: 419mL-matching the infarct by CT A page has been placed to Dr. Thad Rangereynolds. IMPRESSION: 1. Completed 9 cc left parietal infarct which correlates well with a left M4 branch occlusion. 2. No causative flow limiting stenosis or embolic source identified. 3. 50% atheromatous narrowing of the right right ICA bulb. 4. 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 Electronically Signed   By: Marnee SpringJonathon  Watts M.D.   On: 09/11/2018 13:18   Ct Head Wo Contrast  Result Date: 09/11/2018 CLINICAL DATA:  Confusion and dysarthria EXAM: CT HEAD WITHOUT CONTRAST TECHNIQUE: Contiguous axial images were obtained from the base of the skull through the vertex without intravenous contrast. COMPARISON:  None. FINDINGS: Brain: There is mild diffuse atrophy. There is no intracranial mass, hemorrhage, extra-axial fluid collection, or midline shift. There is focal decreased attenuation with appearance sulcal effacement at the junction of the left superficial posterior temporal, superficial lateral occipital, and inferior parietal lobes. This area is felt to represent a likely acute infarct. Elsewhere there is relatively mild patchy small vessel disease in the centra semiovale bilaterally.  Vascular: There is a subtle hyperdense vessel involving a portion of the M2 segment of the left middle cerebral artery. There is no appreciable vascular calcification. Skull: Bony calvarium appears intact. Sinuses/Orbits: There is mucosal thickening in several ethmoid air cells. Other visualized paranasal sinuses are clear. Visualized orbits appear symmetric bilaterally. Evidence of previous cataract removal on the left. Other: Mastoid air cells are clear. IMPRESSION: 1. Evidence of acute infarct at the junction of the left occipital, temporal, and parietal lobes. In this area, there is focal decreased attenuation in localized sulcal effacement. 2. Area hyperdense vessel involving a portion of the M2 segment of the left middle cerebral artery. 3. Atrophy with mild periventricular small vessel disease. No hemorrhage. 4.  Mucosal thickening in several ethmoid air cells. These results were called by telephone at the time of interpretation on 09/11/2018 at 12:11 pm to Dr. Sharman CheekPHILLIP STAFFORD , who verbally acknowledged these results. Electronically Signed   By: Bretta BangWilliam  Woodruff III M.D.   On: 09/11/2018 12:12   Ct Angio Neck W Or Wo Contrast  Result Date: 09/11/2018 CLINICAL DATA:  Confusion and altered mental status. Trouble finding words. EXAM: CT ANGIOGRAPHY HEAD AND NECK CT PERFUSION BRAIN TECHNIQUE: Multidetector CT imaging of the head and neck was performed using the standard protocol during bolus administration of intravenous contrast. Multiplanar CT image reconstructions and MIPs were obtained to evaluate the vascular anatomy. Carotid stenosis measurements (when applicable) are obtained utilizing NASCET criteria, using the  distal internal carotid diameter as the denominator. Multiphase CT imaging of the brain was performed following IV bolus contrast injection. Subsequent parametric perfusion maps were calculated using RAPID software. CONTRAST:  OMNIPAQUE IOHEXOL 350 MG/ML SOLN COMPARISON:  Head CT from  earlier today FINDINGS: CTA NECK FINDINGS Aortic arch: Atherosclerotic plaque. Three vessel branching. No aneurysm or dissection. Right carotid system: Calcified plaque at the right ICA bulb with 50% stenosis on coronal reformats. No ulceration. No beading. Left carotid system: Moderate calcified plaque at the common carotid bifurcation without stenosis or ulceration. ICA tortuosity. Vertebral arteries: Proximal subclavian atherosclerosis without flow limiting stenosis. There is plaque at the origin of the right vertebral artery with mild stenosis. Tortuous vertebral arteries without evidence of dissection or beading. Skeleton: Advanced degenerative disease. Other neck: No evidence of mass or inflammation. Upper chest: No acute finding. Review of the MIP images confirms the above findings CTA HEAD FINDINGS Anterior circulation: Left M4 branch occlusion marked on series 7. The level of occlusion correlates with the size of infarct by CT. There is atherosclerotic plaque on the carotid siphons. Wide necked superior hypophyseal region aneurysm from the ICA on the left measuring 4 x 3 mm. Posterior circulation: Symmetric vertebral arteries. The vertebral and basilar arteries are smooth and widely patent. No branch occlusion or beading. Venous sinuses: Patent Anatomic variants: None significant Delayed phase: Not obtained in the emergent setting Review of the MIP images confirms the above findings CT Brain Perfusion Findings: CBF (<30%) Volume: 26mL-but infarct is present infarct in the left parietal region by CT. Perfusion (Tmax>6.0s) volume: 74mL-matching the infarct by CT A page has been placed to Dr. Thad Ranger. IMPRESSION: 1. Completed 9 cc left parietal infarct which correlates well with a left M4 branch occlusion. 2. No causative flow limiting stenosis or embolic source identified. 3. 50% atheromatous narrowing of the right right ICA bulb. 4. 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 Electronically Signed    By: Marnee Spring M.D.   On: 09/11/2018 13:18   Ct Cerebral Perfusion W Contrast  Result Date: 09/11/2018 CLINICAL DATA:  Confusion and altered mental status. Trouble finding words. EXAM: CT ANGIOGRAPHY HEAD AND NECK CT PERFUSION BRAIN TECHNIQUE: Multidetector CT imaging of the head and neck was performed using the standard protocol during bolus administration of intravenous contrast. Multiplanar CT image reconstructions and MIPs were obtained to evaluate the vascular anatomy. Carotid stenosis measurements (when applicable) are obtained utilizing NASCET criteria, using the distal internal carotid diameter as the denominator. Multiphase CT imaging of the brain was performed following IV bolus contrast injection. Subsequent parametric perfusion maps were calculated using RAPID software. CONTRAST:  OMNIPAQUE IOHEXOL 350 MG/ML SOLN COMPARISON:  Head CT from earlier today FINDINGS: CTA NECK FINDINGS Aortic arch: Atherosclerotic plaque. Three vessel branching. No aneurysm or dissection. Right carotid system: Calcified plaque at the right ICA bulb with 50% stenosis on coronal reformats. No ulceration. No beading. Left carotid system: Moderate calcified plaque at the common carotid bifurcation without stenosis or ulceration. ICA tortuosity. Vertebral arteries: Proximal subclavian atherosclerosis without flow limiting stenosis. There is plaque at the origin of the right vertebral artery with mild stenosis. Tortuous vertebral arteries without evidence of dissection or beading. Skeleton: Advanced degenerative disease. Other neck: No evidence of mass or inflammation. Upper chest: No acute finding. Review of the MIP images confirms the above findings CTA HEAD FINDINGS Anterior circulation: Left M4 branch occlusion marked on series 7. The level of occlusion correlates with the size of infarct  by CT. There is atherosclerotic plaque on the carotid siphons. Wide necked superior hypophyseal region aneurysm from the ICA on  the left measuring 4 x 3 mm. Posterior circulation: Symmetric vertebral arteries. The vertebral and basilar arteries are smooth and widely patent. No branch occlusion or beading. Venous sinuses: Patent Anatomic variants: None significant Delayed phase: Not obtained in the emergent setting Review of the MIP images confirms the above findings CT Brain Perfusion Findings: CBF (<30%) Volume: 33mL-but infarct is present infarct in the left parietal region by CT. Perfusion (Tmax>6.0s) volume: 51mL-matching the infarct by CT A page has been placed to Dr. Thad Ranger. IMPRESSION: 1. Completed 9 cc left parietal infarct which correlates well with a left M4 branch occlusion. 2. No causative flow limiting stenosis or embolic source identified. 3. 50% atheromatous narrowing of the right right ICA bulb. 4. 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 Electronically Signed   By: Marnee Spring M.D.   On: 09/11/2018 13:18    EKG:   Atrial fibrillation 88 bpm nonspecific ST-T wave changes  IMPRESSION AND PLAN:   1.  acute stroke left parietal area with atrial fibrillation.  Start aspirin 325 mg daily.  Neurology consultation.  Can consider anticoagulation prior to discharge.  Stroke work-up with MRI of the brain, carotid ultrasound and echocardiogram. 2.  Hypertension.  Continue Ziac.  Hold other antihypertensive medications 3.  Hyperlipidemia unspecified.  Check a lipid profile tomorrow.  Increase Crestor to 20 mg daily 4.  Hypothyroidism unspecified.  Continue levothyroxine 5.  Impaired fasting glucose.  Check a hemoglobin A1c.  Put on sliding scale 6.  Hypokalemia replace potassium   All the records are reviewed and case discussed with ED provider. Management plans discussed with the patient, family and they are in agreement.  CODE STATUS: full code  TOTAL TIME TAKING CARE OF THIS PATIENT: 50  minutes.    Alford Highland M.D on 09/11/2018 at 2:28 PM  Between 7am to 6pm - Pager - [phone]  After  6pm call admission pager [phone]  [facility]  [phone]  CC: Primary care physician; Leotis Shames, MD

## 2018-09-11 NOTE — ED Notes (Signed)
Code  Stroke  Called  To [phone] 

## 2018-09-11 NOTE — ED Notes (Addendum)
Pt assisted to and from bathroom by this RN, pt ambulates with steady gait. Pt and family at bedside aware of POC - MRI & admission -  and deny needs/complaints. Call bell within reach.

## 2018-09-11 NOTE — ED Notes (Signed)
Pt returned from CT with this RN on cardiac monitor and cont pulse ox. ABCs intact. VSS. NAD

## 2018-09-11 NOTE — Progress Notes (Signed)
Patient ID: Jill Mitchell, female   DOB: [birth year], 84 y.o.   MRN: [id number]  ACP discussion  Patient, daughter and son-in-law at the bedside  Diagnosis: Acute stroke in the left parietal area, atrial fibrillation, hypertension, hyperlipidemia, hypothyroidism, impaired fasting glucose, hypokalemia  CODE STATUS discussed.  Patient and family had a real difficult decision trying to figure out what they wanted to do in the emergency situation.  At this time they decided to go with full code.  I advised if they change their mind they can let the nursing staff know and they will let the medical team know.  Plan.  Starting aspirin for acute stroke.  Stroke work-up with MRI of the brain, carotid ultrasound and echocardiogram.  Will have to consider major anticoagulation prior to discharge for her atrial fibrillation.  Time spent on ACP discussion 17 minutes Dr. Alford Highlandichard Davisha Linthicum

## 2018-09-11 NOTE — ED Provider Notes (Signed)
[facility] Emergency Department Provider Note  ____________________________________________  Time seen: Approximately 12:01 PM  I have reviewed the triage vital signs and the nursing notes.   HISTORY  Chief Complaint Altered Mental Status  Level 5 Caveat: Portions of the History and Physical including HPI and review of systems are unable to be completely obtained due to patient being a poor historian due to confusion   HPI Jill Mitchell is a 84 y.o. female with a past medical history of hypothyroidism hypertension hyperlipidemia who comes to the ED due to confusion.  When daughter spoke to her last night at 7:00 PM the patient was in her usual state of health.  This morning, the patient called her daughter and reported that she fell off, but she was having trouble using the microwave.  Daughter reports that the patient was also having trouble finding words and speaking clearly.  Never had anything like this before.  No recent falls or trauma.  No recent infections.  Only other symptom is generalized weakness for the past week.   Patient reports that she woke up at 8:00 AM with these symptoms.  No known history of atrial fibrillation or carotid stenosis   Past Medical History:  Diagnosis Date  . Hyperlipidemia   . Hypertension   . Hypothyroid      Patient Active Problem List   Diagnosis Date Noted  . CKD (chronic kidney disease) stage 3, GFR 30-59 ml/min (HCC) 04/12/2017  . Renal insufficiency 01/11/2017  . Seasonal allergic rhinitis 04/14/2016  . Prediabetes 08/27/2015  . Chronic anxiety 02/26/2015  . Pure hypercholesterolemia 02/26/2015  . Benign essential hypertension 05/26/2014  . Hyperglycemia, unspecified 05/26/2014  . Hyperlipidemia, unspecified 05/26/2014  . Hypothyroidism, unspecified 05/26/2014     Past Surgical History:  Procedure Laterality Date  . BREAST EXCISIONAL BIOPSY Bilateral 1970   benign     Prior to Admission  medications   Medication Sig Start Date End Date Taking? Authorizing Provider  amLODipine (NORVASC) 10 MG tablet Take 10 mg by mouth daily.  01/11/17  Yes [provider]  bisoprolol-hydrochlorothiazide (ZIAC) 2.5-6.25 MG tablet Take 1 tablet by mouth daily.  03/06/18 03/06/19 Yes [provider]  calcium citrate-vitamin D (CITRACAL+D) 315-200 MG-UNIT tablet Take 2 tablets by mouth daily.    Yes [provider]  cyanocobalamin (,VITAMIN B-12,) 1000 MCG/ML injection Inject 1,000 mcg into the muscle every 30 (thirty) days.   Yes [provider]  fluticasone (FLONASE) 50 MCG/ACT nasal spray Place 2 sprays into both nostrils daily.   Yes [provider]  levothyroxine (SYNTHROID, LEVOTHROID) 75 MCG tablet Take 75 mcg by mouth daily.  01/11/17  Yes [provider]  loratadine (CLARITIN) 10 MG tablet Take 10 mg by mouth daily.   Yes [provider]  LORazepam (ATIVAN) 1 MG tablet Take 0.5 mg by mouth at bedtime as needed for anxiety.  05/22/17  Yes [provider]  rosuvastatin (CRESTOR) 10 MG tablet Take 10 mg by mouth daily.  10/21/16  Yes [provider]     Allergies Aspirin; Atorvastatin; Cholestyramine; Codeine; Lovastatin; Niacin; Penicillins; Pravastatin; Ramipril; and Simvastatin   Family History  Problem Relation Age of Onset  . Breast cancer Sister 46  . Breast cancer Paternal Aunt 36    Social History Social History   Tobacco Use  . Smoking status: Never Smoker  Substance Use Topics  . Alcohol use: Not Currently  . Drug use: Never    Review of Systems  Constitutional:   No fever or chills.  ENT:   No sore throat. No rhinorrhea. Cardiovascular:   No chest pain or syncope. Respiratory:   No dyspnea or cough. Gastrointestinal:   Negative for abdominal pain, vomiting and diarrhea.  Musculoskeletal:   Negative for focal pain or swelling All other systems reviewed and are negative except as documented  above in ROS and HPI.  ____________________________________________   PHYSICAL EXAM:  VITAL SIGNS: ED Triage Vitals  Enc Vitals Group     BP 09/11/18 1146 (!) 159/86     Pulse Rate 09/11/18 1146 93     Resp 09/11/18 1146 16     Temp 09/11/18 1146 98.2 F (36.8 C)     Temp Source 09/11/18 1146 Oral     SpO2 09/11/18 1146 96 %     Weight 09/11/18 1147 124 lb (56.2 kg)     Height 09/11/18 1147 5\' 6"  (1.676 m)     Head Circumference --      Peak Flow --      Pain Score 09/11/18 1147 0     Pain Loc --      Pain Edu? --      Excl. in GC? --     Vital signs reviewed, nursing assessments reviewed.   Constitutional:   Alert and oriented. Non-toxic appearance. Eyes:   Conjunctivae are normal. EOMI. PERRL. ENT      Head:   Normocephalic and atraumatic.      Nose:   No congestion/rhinnorhea.       Mouth/Throat:   MMM, no pharyngeal erythema. No peritonsillar mass.       Neck:   No meningismus. Full ROM. Hematological/Lymphatic/Immunilogical:   No cervical lymphadenopathy. Cardiovascular:   RRR. Symmetric bilateral radial and DP pulses.  No murmurs. Cap refill less than 2 seconds. Respiratory:   Normal respiratory effort without tachypnea/retractions. Breath sounds are clear and equal bilaterally. No wheezes/rales/rhonchi. Gastrointestinal:   Soft and nontender. Non distended. There is no CVA tenderness.  No rebound, rigidity, or guarding. Musculoskeletal:   Normal range of motion in all extremities. No joint effusions.  No lower extremity tenderness.  No edema. Neurologic:   Normal speech.  Language deficit with word finding and reading difficulty. Motor grossly intact. No pronator drift.  Normal heel-to-shin. NIH stroke scale 1 Skin:    Skin is warm, dry and intact. No rash noted.  No petechiae, purpura, or bullae.  ____________________________________________    LABS (pertinent positives/negatives) (all labs ordered are listed, but only abnormal results are displayed) Labs  Reviewed  DIFFERENTIAL - Abnormal; Notable for the following components:      Result Value   Neutro Abs 7.0 (*)    All other components within normal limits  COMPREHENSIVE METABOLIC PANEL - Abnormal; Notable for the following components:   Glucose, Bld 207 (*)    GFR calc non Af Amer 53 (*)    All other components within normal limits  GLUCOSE, CAPILLARY - Abnormal; Notable for the following components:   Glucose-Capillary 180 (*)    All other components within normal limits  PROTIME-INR  APTT  CBC  TROPONIN I  CBG MONITORING, ED   ____________________________________________   EKG  Interpreted by me Atrial fibrillation rate of 88, normal axis and intervals.  Normal QRS ST segments and T waves.  ____________________________________________    RADIOLOGY  Ct Angio Head W Or Wo Contrast  Result Date: 09/11/2018 CLINICAL DATA:  Confusion and altered mental status. Trouble finding words. EXAM: CT  ANGIOGRAPHY HEAD AND NECK CT PERFUSION BRAIN TECHNIQUE: Multidetector CT imaging of the head and neck was performed using the standard protocol during bolus administration of intravenous contrast. Multiplanar CT image reconstructions and MIPs were obtained to evaluate the vascular anatomy. Carotid stenosis measurements (when applicable) are obtained utilizing NASCET criteria, using the distal internal carotid diameter as the denominator. Multiphase CT imaging of the brain was performed following IV bolus contrast injection. Subsequent parametric perfusion maps were calculated using RAPID software. CONTRAST:  OMNIPAQUE IOHEXOL 350 MG/ML SOLN COMPARISON:  Head CT from earlier today FINDINGS: CTA NECK FINDINGS Aortic arch: Atherosclerotic plaque. Three vessel branching. No aneurysm or dissection. Right carotid system: Calcified plaque at the right ICA bulb with 50% stenosis on coronal reformats. No ulceration. No beading. Left carotid system: Moderate calcified plaque at the common carotid  bifurcation without stenosis or ulceration. ICA tortuosity. Vertebral arteries: Proximal subclavian atherosclerosis without flow limiting stenosis. There is plaque at the origin of the right vertebral artery with mild stenosis. Tortuous vertebral arteries without evidence of dissection or beading. Skeleton: Advanced degenerative disease. Other neck: No evidence of mass or inflammation. Upper chest: No acute finding. Review of the MIP images confirms the above findings CTA HEAD FINDINGS Anterior circulation: Left M4 branch occlusion marked on series 7. The level of occlusion correlates with the size of infarct by CT. There is atherosclerotic plaque on the carotid siphons. Wide necked superior hypophyseal region aneurysm from the ICA on the left measuring 4 x 3 mm. Posterior circulation: Symmetric vertebral arteries. The vertebral and basilar arteries are smooth and widely patent. No branch occlusion or beading. Venous sinuses: Patent Anatomic variants: None significant Delayed phase: Not obtained in the emergent setting Review of the MIP images confirms the above findings CT Brain Perfusion Findings: CBF (<30%) Volume: 45mL-but infarct is present infarct in the left parietal region by CT. Perfusion (Tmax>6.0s) volume: 71mL-matching the infarct by CT A page has been placed to Dr. Thad Ranger. IMPRESSION: 1. Completed 9 cc left parietal infarct which correlates well with a left M4 branch occlusion. 2. No causative flow limiting stenosis or embolic source identified. 3. 50% atheromatous narrowing of the right right ICA bulb. 4. 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 Electronically Signed   By: Marnee Spring M.D.   On: 09/11/2018 13:18   Ct Head Wo Contrast  Result Date: 09/11/2018 CLINICAL DATA:  Confusion and dysarthria EXAM: CT HEAD WITHOUT CONTRAST TECHNIQUE: Contiguous axial images were obtained from the base of the skull through the vertex without intravenous contrast. COMPARISON:  None. FINDINGS:  Brain: There is mild diffuse atrophy. There is no intracranial mass, hemorrhage, extra-axial fluid collection, or midline shift. There is focal decreased attenuation with appearance sulcal effacement at the junction of the left superficial posterior temporal, superficial lateral occipital, and inferior parietal lobes. This area is felt to represent a likely acute infarct. Elsewhere there is relatively mild patchy small vessel disease in the centra semiovale bilaterally. Vascular: There is a subtle hyperdense vessel involving a portion of the M2 segment of the left middle cerebral artery. There is no appreciable vascular calcification. Skull: Bony calvarium appears intact. Sinuses/Orbits: There is mucosal thickening in several ethmoid air cells. Other visualized paranasal sinuses are clear. Visualized orbits appear symmetric bilaterally. Evidence of previous cataract removal on the left. Other: Mastoid air cells are clear. IMPRESSION: 1. Evidence of acute infarct at the junction of the left occipital, temporal, and parietal lobes. In this area, there is focal decreased attenuation  in localized sulcal effacement. 2. Area hyperdense vessel involving a portion of the M2 segment of the left middle cerebral artery. 3. Atrophy with mild periventricular small vessel disease. No hemorrhage. 4.  Mucosal thickening in several ethmoid air cells. These results were called by telephone at the time of interpretation on 09/11/2018 at 12:11 pm to Dr. Sharman Cheek , who verbally acknowledged these results. Electronically Signed   By: Bretta Bang III M.D.   On: 09/11/2018 12:12   Ct Angio Neck W Or Wo Contrast  Result Date: 09/11/2018 CLINICAL DATA:  Confusion and altered mental status. Trouble finding words. EXAM: CT ANGIOGRAPHY HEAD AND NECK CT PERFUSION BRAIN TECHNIQUE: Multidetector CT imaging of the head and neck was performed using the standard protocol during bolus administration of intravenous contrast.  Multiplanar CT image reconstructions and MIPs were obtained to evaluate the vascular anatomy. Carotid stenosis measurements (when applicable) are obtained utilizing NASCET criteria, using the distal internal carotid diameter as the denominator. Multiphase CT imaging of the brain was performed following IV bolus contrast injection. Subsequent parametric perfusion maps were calculated using RAPID software. CONTRAST:  OMNIPAQUE IOHEXOL 350 MG/ML SOLN COMPARISON:  Head CT from earlier today FINDINGS: CTA NECK FINDINGS Aortic arch: Atherosclerotic plaque. Three vessel branching. No aneurysm or dissection. Right carotid system: Calcified plaque at the right ICA bulb with 50% stenosis on coronal reformats. No ulceration. No beading. Left carotid system: Moderate calcified plaque at the common carotid bifurcation without stenosis or ulceration. ICA tortuosity. Vertebral arteries: Proximal subclavian atherosclerosis without flow limiting stenosis. There is plaque at the origin of the right vertebral artery with mild stenosis. Tortuous vertebral arteries without evidence of dissection or beading. Skeleton: Advanced degenerative disease. Other neck: No evidence of mass or inflammation. Upper chest: No acute finding. Review of the MIP images confirms the above findings CTA HEAD FINDINGS Anterior circulation: Left M4 branch occlusion marked on series 7. The level of occlusion correlates with the size of infarct by CT. There is atherosclerotic plaque on the carotid siphons. Wide necked superior hypophyseal region aneurysm from the ICA on the left measuring 4 x 3 mm. Posterior circulation: Symmetric vertebral arteries. The vertebral and basilar arteries are smooth and widely patent. No branch occlusion or beading. Venous sinuses: Patent Anatomic variants: None significant Delayed phase: Not obtained in the emergent setting Review of the MIP images confirms the above findings CT Brain Perfusion Findings: CBF (<30%) Volume:  22mL-but infarct is present infarct in the left parietal region by CT. Perfusion (Tmax>6.0s) volume: 65mL-matching the infarct by CT A page has been placed to Dr. Thad Ranger. IMPRESSION: 1. Completed 9 cc left parietal infarct which correlates well with a left M4 branch occlusion. 2. No causative flow limiting stenosis or embolic source identified. 3. 50% atheromatous narrowing of the right right ICA bulb. 4. 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 Electronically Signed   By: Marnee Spring M.D.   On: 09/11/2018 13:18   Ct Cerebral Perfusion W Contrast  Result Date: 09/11/2018 CLINICAL DATA:  Confusion and altered mental status. Trouble finding words. EXAM: CT ANGIOGRAPHY HEAD AND NECK CT PERFUSION BRAIN TECHNIQUE: Multidetector CT imaging of the head and neck was performed using the standard protocol during bolus administration of intravenous contrast. Multiplanar CT image reconstructions and MIPs were obtained to evaluate the vascular anatomy. Carotid stenosis measurements (when applicable) are obtained utilizing NASCET criteria, using the distal internal carotid diameter as the denominator. Multiphase CT imaging of the brain was performed following IV  bolus contrast injection. Subsequent parametric perfusion maps were calculated using RAPID software. CONTRAST:  OMNIPAQUE IOHEXOL 350 MG/ML SOLN COMPARISON:  Head CT from earlier today FINDINGS: CTA NECK FINDINGS Aortic arch: Atherosclerotic plaque. Three vessel branching. No aneurysm or dissection. Right carotid system: Calcified plaque at the right ICA bulb with 50% stenosis on coronal reformats. No ulceration. No beading. Left carotid system: Moderate calcified plaque at the common carotid bifurcation without stenosis or ulceration. ICA tortuosity. Vertebral arteries: Proximal subclavian atherosclerosis without flow limiting stenosis. There is plaque at the origin of the right vertebral artery with mild stenosis. Tortuous vertebral arteries  without evidence of dissection or beading. Skeleton: Advanced degenerative disease. Other neck: No evidence of mass or inflammation. Upper chest: No acute finding. Review of the MIP images confirms the above findings CTA HEAD FINDINGS Anterior circulation: Left M4 branch occlusion marked on series 7. The level of occlusion correlates with the size of infarct by CT. There is atherosclerotic plaque on the carotid siphons. Wide necked superior hypophyseal region aneurysm from the ICA on the left measuring 4 x 3 mm. Posterior circulation: Symmetric vertebral arteries. The vertebral and basilar arteries are smooth and widely patent. No branch occlusion or beading. Venous sinuses: Patent Anatomic variants: None significant Delayed phase: Not obtained in the emergent setting Review of the MIP images confirms the above findings CT Brain Perfusion Findings: CBF (<30%) Volume: 65mL-but infarct is present infarct in the left parietal region by CT. Perfusion (Tmax>6.0s) volume: 48mL-matching the infarct by CT A page has been placed to Dr. Thad Ranger. IMPRESSION: 1. Completed 9 cc left parietal infarct which correlates well with a left M4 branch occlusion. 2. No causative flow limiting stenosis or embolic source identified. 3. 50% atheromatous narrowing of the right right ICA bulb. 4. 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 Electronically Signed   By: Marnee Spring M.D.   On: 09/11/2018 13:18    ____________________________________________   PROCEDURES .Critical Care Performed by: Sharman Cheek, MD Authorized by: Sharman Cheek, MD   Critical care provider statement:    Critical care time (minutes):  30   Critical care time was exclusive of:  Separately billable procedures and treating other patients   Critical care was necessary to treat or prevent imminent or life-threatening deterioration of the following conditions:  CNS failure or compromise   Critical care was time spent personally by me on  the following activities:  Development of treatment plan with patient or surrogate, discussions with consultants, evaluation of patient's response to treatment, examination of patient, obtaining history from patient or surrogate, ordering and performing treatments and interventions, ordering and review of laboratory studies, ordering and review of radiographic studies, pulse oximetry, re-evaluation of patient's condition and review of old charts    ____________________________________________  DIFFERENTIAL DIAGNOSIS   Acute ischemic stroke, urinary tract infection, pneumonia, dehydration, electrolyte abnormality.  Doubt hemorrhagic stroke, sepsis, glaucoma, pseudotumor, temporal arteritis, skin or soft tissue infection  CLINICAL IMPRESSION / ASSESSMENT AND PLAN / ED COURSE  Pertinent labs & imaging results that were available during my care of the patient were reviewed by me and considered in my medical decision making (see chart for details).      Clinical Course as of Sep 11 1350  Tue Sep 11, 2018  1159 Saw pt briefly while preparing to go straight to CT. Awake, NAD. LKW 7:00pm last night.   [PS]  1235 Dr. Thad Ranger at bedside   [PS]  1256 D/w Dr. Thad Ranger, will proceed with  CTA/CTP to further assess. Will plan for admission and further stroke care and workup, pending need for transfer/intervention   [PS]  1345 CTA/CTP results discussed with Dr. Thad Rangereynolds.  Completed stroke, no role for intervention.  Oral aspirin, hospitalist admission.  Aspirin is listed as an allergy which causes "bruising", the benefits versus risk of which are favorable at this point given her A. fib on EKG and current stroke.   [PS]    Clinical Course User Index [PS] Sharman CheekStafford, Cassidi Modesitt, MD    ----------------------------------------- 1:51 PM on 09/11/2018 -----------------------------------------  Discussed with hospitalist for further management of acute stroke and new onset atrial fibrillation.  Oral  aspirin ordered.   ____________________________________________   FINAL CLINICAL IMPRESSION(S) / ED DIAGNOSES    Final diagnoses:  Acute ischemic stroke [facility](HCC)  New onset atrial fibrillation [facility](HCC)     ED Discharge Orders    None      Portions of this note were generated with dragon dictation software. Dictation errors may occur despite best attempts at proofreading.    Sharman CheekStafford, Alesa Echevarria, MD 09/11/18 1352

## 2018-09-11 NOTE — Progress Notes (Signed)
Chaplain responded to a code stroke. Daughter and son in law were at the bedside. Chaplain practiced pastoral presence and silent prayer. Chaplain prayed for the healing of the patient and restoration. Family and Pt were anxious about diagnosis of a stroke. Chaplain offered another prayer for restoration.    09/11/18 1200  Clinical Encounter Type  Visited With Patient and family together  Visit Type Initial;Code  Referral From Care management  Spiritual Encounters  Spiritual Needs Prayer;Emotional

## 2018-09-11 NOTE — ED Notes (Signed)
Neuro MD at bedside giving pt and family an update

## 2018-09-11 NOTE — ED Notes (Signed)
Pt returned from US. ABCs intact. NAD.  

## 2018-09-11 NOTE — ED Notes (Signed)
Pt to US. ABCs intact. NAD. 

## 2018-09-11 NOTE — ED Notes (Signed)
Pt to CT on cardiac monitor and continuous pulse ox with this RN. ABCs intact. VSS. NAD

## 2018-09-12 ENCOUNTER — Encounter: Payer: Self-pay | Admitting: Physician Assistant

## 2018-09-12 DIAGNOSIS — Z794 Long term (current) use of insulin: Secondary | ICD-10-CM

## 2018-09-12 DIAGNOSIS — I639 Cerebral infarction, unspecified: Secondary | ICD-10-CM | POA: Diagnosis not present

## 2018-09-12 DIAGNOSIS — I4891 Unspecified atrial fibrillation: Secondary | ICD-10-CM

## 2018-09-12 DIAGNOSIS — E118 Type 2 diabetes mellitus with unspecified complications: Secondary | ICD-10-CM | POA: Diagnosis not present

## 2018-09-12 DIAGNOSIS — I6310 Cerebral infarction due to embolism of unspecified precerebral artery: Secondary | ICD-10-CM

## 2018-09-12 DIAGNOSIS — I10 Essential (primary) hypertension: Secondary | ICD-10-CM

## 2018-09-12 LAB — LIPID PANEL
CHOL/HDL RATIO: 3.6 ratio
CHOLESTEROL: 182 mg/dL (ref 0–200)
HDL: 51 mg/dL (ref >40)
LDL Cholesterol: 110 mg/dL — ABNORMAL HIGH (ref 0–99)
Triglycerides: 106 mg/dL (ref <150)
VLDL: 21 mg/dL (ref 0–40)

## 2018-09-12 LAB — ECHOCARDIOGRAM COMPLETE
Height: 66 in
Weight: 1984 oz

## 2018-09-12 LAB — CBC
HCT: 39.6 % (ref 35.0–47.0)
HEMOGLOBIN: 13.5 g/dL (ref 12.0–16.0)
MCH: 31.8 pg (ref 26.0–34.0)
MCHC: 34 g/dL (ref 32.0–36.0)
MCV: 93.5 fL (ref 80.0–100.0)
Platelets: 376 10*3/uL (ref 150–440)
RBC: 4.23 MIL/uL (ref 3.80–5.20)
RDW: 12.9 % (ref 11.5–14.5)
WBC: 8.6 10*3/uL (ref 3.6–11.0)

## 2018-09-12 LAB — BASIC METABOLIC PANEL
Anion gap: 8 (ref 5–15)
BUN: 12 mg/dL (ref 8–23)
CHLORIDE: 105 mmol/L (ref 98–111)
CO2: 23 mmol/L (ref 22–32)
Calcium: 8.7 mg/dL — ABNORMAL LOW (ref 8.9–10.3)
Creatinine, Ser: 0.59 mg/dL (ref 0.44–1.00)
GFR calc Af Amer: >60 mL/min (ref >60)
GFR calc non Af Amer: >60 mL/min (ref >60)
GLUCOSE: 125 mg/dL — AB (ref 70–99)
POTASSIUM: 3.8 mmol/L (ref 3.5–5.1)
Sodium: 136 mmol/L (ref 135–145)

## 2018-09-12 LAB — MAGNESIUM: Magnesium: 2.6 mg/dL — ABNORMAL HIGH (ref 1.7–2.4)

## 2018-09-12 MED ORDER — ASPIRIN 325 MG PO TBEC: 325.0000 mg | DELAYED_RELEASE_TABLET | Freq: Every day | ORAL | 0 refills | Status: AC

## 2018-09-12 MED ORDER — APIXABAN 2.5 MG PO TABS: 2.5000 mg | ORAL_TABLET | Freq: Two times a day (BID) | ORAL | 1 refills | Status: DC

## 2018-09-12 MED ORDER — ROSUVASTATIN CALCIUM 20 MG PO TABS: 20.0000 mg | ORAL_TABLET | Freq: Every day | ORAL | 2 refills | Status: DC

## 2018-09-12 MED ORDER — BISOPROLOL-HYDROCHLOROTHIAZIDE 5-6.25 MG PO TABS: 1.0000 | ORAL_TABLET | Freq: Every day | ORAL | 2 refills | Status: DC

## 2018-09-12 MED ORDER — ONDANSETRON HCL 4 MG/2ML IJ SOLN
4.0000 mg | Freq: Four times a day (QID) | INTRAMUSCULAR | Status: DC | PRN
  Administered 2018-09-12: 08:00:00 4 mg via INTRAVENOUS
  Filled 2018-09-12: qty 2

## 2018-09-12 NOTE — Progress Notes (Signed)
Subjective: Patient is awake, alert and oriented x4, speech is much improved from previous. Daughter at bedside, she report that patient's speech is much better.  Objective: Current vital signs: BP 139/66 (BP Location: Left Arm)   Pulse 68   Temp 98.4 F (36.9 C) (Oral)   Resp 16   Ht 5\' 6"  (1.676 m)   Wt 56.2 kg   SpO2 94%   BMI 20.01 kg/m  Vital signs in last 24 hours: Temp:  [97.7 F (36.5 C)-98.4 F (36.9 C)] 98.4 F (36.9 C) (09/25 1610) Pulse Rate:  [51-100] 68 (09/25 0652) Resp:  [15-20] 16 (09/25 0652) BP: (122-159)/(58-91) 139/66 (09/25 0652) SpO2:  [93 %-96 %] 94 % (09/25 0652) Weight:  [56.2 kg] 56.2 kg (09/24 1147)  Intake/Output from previous day: 09/24 0701 - 09/25 0700 In: 240 [P.O.:240] Out: -  Intake/Output this shift: No intake/output data recorded. Nutritional status:  Diet Order            Diet Heart Room service appropriate? Yes; Fluid consistency: Thin  Diet effective now             Neurologic Exam: Mental Status: Alert.  Some difficulty with naming.  Comprehension appears intact. Able to follow commands without difficulty. Cranial Nerves: II: Discs flat bilaterally; Visual fields grossly normal, pupils equal, round, reactive to light and accommodation III,IV, VI: ptosis not present, extra-ocular motions intact bilaterally V,VII: smile symmetric, facial light touch sensation normal bilaterally VIII: hearing normal bilaterally IX,X: gag reflex present XI: bilateral shoulder shrug XII: midline tongue extension Motor: Right :  Upper extremity   5/5                                      Left:     Upper extremity   5/5             Lower extremity   5/5                                                  Lower extremity   5/5 Tone and bulk:normal tone throughout; no atrophy noted Sensory: Pinprick and light touch intact throughout, bilaterally  Lab Results: Basic Metabolic Panel: Recent Labs  Lab 09/11/18 1159 09/12/18 0426  NA 137 136  K  3.5 3.8  CL 101 105  CO2 26 23  GLUCOSE 207* 125*  BUN 15 12  CREATININE 0.91 0.59  CALCIUM 9.3 8.7*  MG  --  2.6*    Liver Function Tests: Recent Labs  Lab 09/11/18 1159  AST 25  ALT 14  ALKPHOS 59  BILITOT 0.6  PROT 7.6  ALBUMIN 4.2   No results for input(s): LIPASE, AMYLASE in the last 168 hours. No results for input(s): AMMONIA in the last 168 hours.  CBC: Recent Labs  Lab 09/11/18 1159 09/12/18 0426  WBC 9.0 8.6  NEUTROABS 7.0*  --   HGB 14.4 13.5  HCT 41.3 39.6  MCV 93.4 93.5  PLT 393 376    Cardiac Enzymes: Recent Labs  Lab 09/11/18 1159  TROPONINI <0.03    Lipid Panel: Recent Labs  Lab 09/12/18 0426  CHOL 182  TRIG 106  HDL 51  CHOLHDL 3.6  VLDL 21  LDLCALC 960*    CBG: Recent Labs  Lab 09/11/18 1227  GLUCAP 180*    Microbiology: No results found for this or any previous visit.  Coagulation Studies: Recent Labs    09/11/18 1159  LABPROT 12.3  INR 0.92    Imaging: Ct Angio Head W Or Wo Contrast  Result Date: 09/11/2018 CLINICAL DATA:  Confusion and altered mental status. Trouble finding words. EXAM: CT ANGIOGRAPHY HEAD AND NECK CT PERFUSION BRAIN TECHNIQUE: Multidetector CT imaging of the head and neck was performed using the standard protocol during bolus administration of intravenous contrast. Multiplanar CT image reconstructions and MIPs were obtained to evaluate the vascular anatomy. Carotid stenosis measurements (when applicable) are obtained utilizing NASCET criteria, using the distal internal carotid diameter as the denominator. Multiphase CT imaging of the brain was performed following IV bolus contrast injection. Subsequent parametric perfusion maps were calculated using RAPID software. CONTRAST:  OMNIPAQUE IOHEXOL 350 MG/ML SOLN COMPARISON:  Head CT from earlier today FINDINGS: CTA NECK FINDINGS Aortic arch: Atherosclerotic plaque. Three vessel branching. No aneurysm or dissection. Right carotid system: Calcified  plaque at the right ICA bulb with 50% stenosis on coronal reformats. No ulceration. No beading. Left carotid system: Moderate calcified plaque at the common carotid bifurcation without stenosis or ulceration. ICA tortuosity. Vertebral arteries: Proximal subclavian atherosclerosis without flow limiting stenosis. There is plaque at the origin of the right vertebral artery with mild stenosis. Tortuous vertebral arteries without evidence of dissection or beading. Skeleton: Advanced degenerative disease. Other neck: No evidence of mass or inflammation. Upper chest: No acute finding. Review of the MIP images confirms the above findings CTA HEAD FINDINGS Anterior circulation: Left M4 branch occlusion marked on series 7. The level of occlusion correlates with the size of infarct by CT. There is atherosclerotic plaque on the carotid siphons. Wide necked superior hypophyseal region aneurysm from the ICA on the left measuring 4 x 3 mm. Posterior circulation: Symmetric vertebral arteries. The vertebral and basilar arteries are smooth and widely patent. No branch occlusion or beading. Venous sinuses: Patent Anatomic variants: None significant Delayed phase: Not obtained in the emergent setting Review of the MIP images confirms the above findings CT Brain Perfusion Findings: CBF (<30%) Volume: 77mL-but infarct is present infarct in the left parietal region by CT. Perfusion (Tmax>6.0s) volume: 51mL-matching the infarct by CT A page has been placed to Dr. Thad Ranger. IMPRESSION: 1. Completed 9 cc left parietal infarct which correlates well with a left M4 branch occlusion. 2. No causative flow limiting stenosis or embolic source identified. 3. 50% atheromatous narrowing of the right right ICA bulb. 4. 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 Electronically Signed   By: Marnee Spring M.D.   On: 09/11/2018 13:18   Ct Head Wo Contrast  Result Date: 09/11/2018 CLINICAL DATA:  Confusion and dysarthria EXAM: CT HEAD WITHOUT  CONTRAST TECHNIQUE: Contiguous axial images were obtained from the base of the skull through the vertex without intravenous contrast. COMPARISON:  None. FINDINGS: Brain: There is mild diffuse atrophy. There is no intracranial mass, hemorrhage, extra-axial fluid collection, or midline shift. There is focal decreased attenuation with appearance sulcal effacement at the junction of the left superficial posterior temporal, superficial lateral occipital, and inferior parietal lobes. This area is felt to represent a likely acute infarct. Elsewhere there is relatively mild patchy small vessel disease in the centra semiovale bilaterally. Vascular: There is a subtle hyperdense vessel involving a portion of the M2 segment of the left middle cerebral artery. There is no appreciable vascular calcification. Skull: Bony  calvarium appears intact. Sinuses/Orbits: There is mucosal thickening in several ethmoid air cells. Other visualized paranasal sinuses are clear. Visualized orbits appear symmetric bilaterally. Evidence of previous cataract removal on the left. Other: Mastoid air cells are clear. IMPRESSION: 1. Evidence of acute infarct at the junction of the left occipital, temporal, and parietal lobes. In this area, there is focal decreased attenuation in localized sulcal effacement. 2. Area hyperdense vessel involving a portion of the M2 segment of the left middle cerebral artery. 3. Atrophy with mild periventricular small vessel disease. No hemorrhage. 4.  Mucosal thickening in several ethmoid air cells. These results were called by telephone at the time of interpretation on 09/11/2018 at 12:11 pm to Dr. Sharman Cheek , who verbally acknowledged these results. Electronically Signed   By: Bretta Bang III M.D.   On: 09/11/2018 12:12   Ct Angio Neck W Or Wo Contrast  Result Date: 09/11/2018 CLINICAL DATA:  Confusion and altered mental status. Trouble finding words. EXAM: CT ANGIOGRAPHY HEAD AND NECK CT PERFUSION BRAIN  TECHNIQUE: Multidetector CT imaging of the head and neck was performed using the standard protocol during bolus administration of intravenous contrast. Multiplanar CT image reconstructions and MIPs were obtained to evaluate the vascular anatomy. Carotid stenosis measurements (when applicable) are obtained utilizing NASCET criteria, using the distal internal carotid diameter as the denominator. Multiphase CT imaging of the brain was performed following IV bolus contrast injection. Subsequent parametric perfusion maps were calculated using RAPID software. CONTRAST:  OMNIPAQUE IOHEXOL 350 MG/ML SOLN COMPARISON:  Head CT from earlier today FINDINGS: CTA NECK FINDINGS Aortic arch: Atherosclerotic plaque. Three vessel branching. No aneurysm or dissection. Right carotid system: Calcified plaque at the right ICA bulb with 50% stenosis on coronal reformats. No ulceration. No beading. Left carotid system: Moderate calcified plaque at the common carotid bifurcation without stenosis or ulceration. ICA tortuosity. Vertebral arteries: Proximal subclavian atherosclerosis without flow limiting stenosis. There is plaque at the origin of the right vertebral artery with mild stenosis. Tortuous vertebral arteries without evidence of dissection or beading. Skeleton: Advanced degenerative disease. Other neck: No evidence of mass or inflammation. Upper chest: No acute finding. Review of the MIP images confirms the above findings CTA HEAD FINDINGS Anterior circulation: Left M4 branch occlusion marked on series 7. The level of occlusion correlates with the size of infarct by CT. There is atherosclerotic plaque on the carotid siphons. Wide necked superior hypophyseal region aneurysm from the ICA on the left measuring 4 x 3 mm. Posterior circulation: Symmetric vertebral arteries. The vertebral and basilar arteries are smooth and widely patent. No branch occlusion or beading. Venous sinuses: Patent Anatomic variants: None significant  Delayed phase: Not obtained in the emergent setting Review of the MIP images confirms the above findings CT Brain Perfusion Findings: CBF (<30%) Volume: 14mL-but infarct is present infarct in the left parietal region by CT. Perfusion (Tmax>6.0s) volume: 42mL-matching the infarct by CT A page has been placed to Dr. Thad Ranger. IMPRESSION: 1. Completed 9 cc left parietal infarct which correlates well with a left M4 branch occlusion. 2. No causative flow limiting stenosis or embolic source identified. 3. 50% atheromatous narrowing of the right right ICA bulb. 4. 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 Electronically Signed   By: Marnee Spring M.D.   On: 09/11/2018 13:18   Mr Brain Wo Contrast  Result Date: 09/12/2018 CLINICAL DATA:  Follow-up examination for acute stroke. EXAM: MRI HEAD WITHOUT CONTRAST TECHNIQUE: Multiplanar, multiecho pulse sequences of the brain  and surrounding structures were obtained without intravenous contrast. COMPARISON:  Comparison made with prior CTs from earlier the same day. FINDINGS: Brain: Diffuse prominence of the CSF containing spaces compatible with generalized age-related cerebral atrophy. Patchy and confluent T2/FLAIR hyperintensity within the periventricular and deep white matter both cerebral hemispheres most consistent with chronic small vessel ischemic disease, moderate nature. Tiny remote right cerebellar infarct noted. Area of restricted diffusion involving the cortical gray matter of the left parieto-occipital region consistent with acute to early subacute ischemic infarct faint petechial hemorrhage without hemorrhagic transformation. No other evidence for acute or subacute ischemia. No mass lesion, midline shift or mass effect. No hydrocephalus. No extra-axial fluid collection. Normal pituitary gland. Vascular: Major intracranial vascular flow voids maintained. Skull and upper cervical spine: Craniocervical junction within normal limits. Degenerative  spondylolysis at C3-4 without significant stenosis noted. No focal marrow replacing lesion. Scalp soft tissues unremarkable. Sinuses/Orbits: Patient status post ocular lens replacement on the left. Paranasal sinuses are clear. Trace bilateral mastoid effusions, of doubtful significance. Other: None. IMPRESSION: 1. Acute to early subacute left parieto-occipital cortical infarct. Associated faint petechial hemorrhage without hemorrhagic transformation. 2. Underlying age-related cerebral atrophy with mild to moderate chronic small vessel ischemic disease. Electronically Signed   By: Rise Mu M.D.   On: 09/12/2018 01:24   US Carotid Bilateral (at [facility] And [facility] Only)  Result Date: 09/11/2018 CLINICAL DATA:  84 year old female with a history of stroke. EXAM: BILATERAL CAROTID DUPLEX ULTRASOUND TECHNIQUE: Wallace Cullens scale imaging, color Doppler and duplex ultrasound were performed of bilateral carotid and vertebral arteries in the neck. COMPARISON:  None. FINDINGS: Criteria: Quantification of carotid stenosis is based on velocity parameters that correlate the residual internal carotid diameter with NASCET-based stenosis levels, using the diameter of the distal internal carotid lumen as the denominator for stenosis measurement. The following velocity measurements were obtained: RIGHT ICA:  Systolic 120 cm/sec, Diastolic 35 cm/sec CCA:  68 cm/sec SYSTOLIC ICA/CCA RATIO:  1.8 ECA:  101 cm/sec LEFT ICA:  Systolic 86 cm/sec, Diastolic 28 cm/sec CCA:  82 cm/sec SYSTOLIC ICA/CCA RATIO:  1.0 ECA:  70 cm/sec Right Brachial SBP: Not acquired Left Brachial SBP: Not acquired RIGHT CAROTID ARTERY: No significant calcifications of the right common carotid artery. Intermediate waveform maintained. Heterogeneous and partially calcified plaque at the right carotid bifurcation. No significant lumen shadowing. Low resistance waveform of the right ICA. Mild tortuosity RIGHT VERTEBRAL ARTERY: Antegrade flow with low resistance  waveform. LEFT CAROTID ARTERY: No significant calcifications of the left common carotid artery. Intermediate waveform maintained. Heterogeneous and partially calcified plaque at the left carotid bifurcation without significant lumen shadowing. Low resistance waveform of the left ICA. Mild tortuosity LEFT VERTEBRAL ARTERY:  Antegrade flow with low resistance waveform. IMPRESSION: Color duplex indicates minimal heterogeneous and calcified plaque, with no hemodynamically significant stenosis by duplex criteria in the extracranial cerebrovascular circulation. Signed, Yvone Neu. Reyne Dumas, RPVI [facility] Electronically Signed   By: Gilmer Mor D.O.   On: 09/11/2018 15:20   Ct Cerebral Perfusion W Contrast  Result Date: 09/11/2018 CLINICAL DATA:  Confusion and altered mental status. Trouble finding words. EXAM: CT ANGIOGRAPHY HEAD AND NECK CT PERFUSION BRAIN TECHNIQUE: Multidetector CT imaging of the head and neck was performed using the standard protocol during bolus administration of intravenous contrast. Multiplanar CT image reconstructions and MIPs were obtained to evaluate the vascular anatomy. Carotid stenosis measurements (when applicable) are obtained utilizing NASCET criteria, using the distal internal carotid diameter as the denominator.  Multiphase CT imaging of the brain was performed following IV bolus contrast injection. Subsequent parametric perfusion maps were calculated using RAPID software. CONTRAST:  OMNIPAQUE IOHEXOL 350 MG/ML SOLN COMPARISON:  Head CT from earlier today FINDINGS: CTA NECK FINDINGS Aortic arch: Atherosclerotic plaque. Three vessel branching. No aneurysm or dissection. Right carotid system: Calcified plaque at the right ICA bulb with 50% stenosis on coronal reformats. No ulceration. No beading. Left carotid system: Moderate calcified plaque at the common carotid bifurcation without stenosis or ulceration. ICA  tortuosity. Vertebral arteries: Proximal subclavian atherosclerosis without flow limiting stenosis. There is plaque at the origin of the right vertebral artery with mild stenosis. Tortuous vertebral arteries without evidence of dissection or beading. Skeleton: Advanced degenerative disease. Other neck: No evidence of mass or inflammation. Upper chest: No acute finding. Review of the MIP images confirms the above findings CTA HEAD FINDINGS Anterior circulation: Left M4 branch occlusion marked on series 7. The level of occlusion correlates with the size of infarct by CT. There is atherosclerotic plaque on the carotid siphons. Wide necked superior hypophyseal region aneurysm from the ICA on the left measuring 4 x 3 mm. Posterior circulation: Symmetric vertebral arteries. The vertebral and basilar arteries are smooth and widely patent. No branch occlusion or beading. Venous sinuses: Patent Anatomic variants: None significant Delayed phase: Not obtained in the emergent setting Review of the MIP images confirms the above findings CT Brain Perfusion Findings: CBF (<30%) Volume: 52mL-but infarct is present infarct in the left parietal region by CT. Perfusion (Tmax>6.0s) volume: 57mL-matching the infarct by CT A page has been placed to Dr. Thad Ranger. IMPRESSION: 1. Completed 9 cc left parietal infarct which correlates well with a left M4 branch occlusion. 2. No causative flow limiting stenosis or embolic source identified. 3. 50% atheromatous narrowing of the right right ICA bulb. 4. 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 Electronically Signed   By: Marnee Spring M.D.   On: 09/11/2018 13:18    Medications:  I have reviewed the patient's current medications. Prior to Admission:  Medications Prior to Admission  Medication Sig Dispense Refill Last Dose  . amLODipine (NORVASC) 10 MG tablet Take 10 mg by mouth daily.    09/11/2018 at 0800  . bisoprolol-hydrochlorothiazide (ZIAC) 2.5-6.25 MG tablet Take 1  tablet by mouth daily.    09/11/2018 at 0800  . calcium citrate-vitamin D (CITRACAL+D) 315-200 MG-UNIT tablet Take 2 tablets by mouth daily.    09/11/2018 at 0800  . cyanocobalamin (,VITAMIN B-12,) 1000 MCG/ML injection Inject 1,000 mcg into the muscle every 30 (thirty) days.   As directed at As directed  . fluticasone (FLONASE) 50 MCG/ACT nasal spray Place 2 sprays into both nostrils daily.   09/10/2018 at Unknown time  . levothyroxine (SYNTHROID, LEVOTHROID) 75 MCG tablet Take 75 mcg by mouth daily.    09/11/2018 at 0700  . loratadine (CLARITIN) 10 MG tablet Take 10 mg by mouth daily.   09/11/2018 at 0800  . LORazepam (ATIVAN) 1 MG tablet Take 0.5 mg by mouth at bedtime as needed for anxiety.    Past Month at PRN  . rosuvastatin (CRESTOR) 10 MG tablet Take 10 mg by mouth daily.    09/10/2018 at Unknown time   Scheduled: . aspirin EC  325 mg Oral Daily  . bisoprolol-hydrochlorothiazide  1 tablet Oral Daily  . calcium citrate-vitamin D  2 tablet Oral Daily  . enoxaparin (LOVENOX) injection  40 mg Subcutaneous Q24H  . fluticasone  2 spray Each  Nare Daily  . levothyroxine  75 mcg Oral QAC breakfast  . loratadine  10 mg Oral Daily  . rosuvastatin  20 mg Oral Daily    Patient seen and examined.  Clinical course and management discussed.  Necessary edits performed.  I agree with the above.  Assessment and plan of care developed and discussed below.    Assessment:  Patient's speech is much improved from previous. Completed MRI brain reviewed and shows acute to early subacute left parieto-occipital cortical infarct with mild to moderate chronic small vessel ischemic changes.  Plan: 1. Prophylactic therapy-ASA  325mg  daily po.  After 5-7 days would start anticoagulation with Eliquis. Cardiology to follow.  2. PT consult, OT consult, Speech consult 3. Echocardiogram pending 4. NPO until RN stroke swallow screen 5. Telemetry monitoring 6. Frequent neuro checks  This patient was staffed with Dr.  Verlon Au, Thad Ranger who personally evaluated patient, reviewed documentation and agreed with assessment and plan of care as above.  Webb Silversmith, DNP, FNP-BC Board certified Nurse Practitioner Neurology Department    LOS: 1 day   09/12/2018  9:16 AM    Thana Farr, MD Neurology [phone]  09/12/2018  11:10 AM

## 2018-09-12 NOTE — Progress Notes (Signed)
Initial Nutrition Assessment  DOCUMENTATION CODES:   Not applicable  INTERVENTION:  Recommend liberalizing diet to regular.  Encouraged adequate intake of protein at meals. Discussed foods that contain protein.  Patient would like to hold off on starting an ONS at this time. Discussed that if she continues to lose weight to add a high-calorie, high-protein ONS as a snack.  NUTRITION DIAGNOSIS:   Inadequate oral intake related to decreased appetite(taste changes) as evidenced by per patient/family report.  GOAL:   Patient will meet greater than or equal to 90% of their needs  MONITOR:   PO intake, Supplement acceptance, Labs, Weight trends, I & O's  REASON FOR ASSESSMENT:   Malnutrition Screening Tool    ASSESSMENT:   84 year old female with PMHx of HTN, HLD, hypothyroidism admitted with acute CVA to left parietal area, A-fib, also with impaired fasting glucose.   Met with patient and her daughter at bedside. Patient reports she has had a decreased appetite due to taste changes recently. She lives alone but is able to prepare 3 meals per day. Her daughter also brings meals over occasionally. For breakfast she typically has cereal or oatmeal. Lunch is usually a meat with vegetables and a fruit. Dinner is usually a Engineer, manufacturing. She also snacks occasionally during the day on peanut butter crackers. Patient wants to hold off on starting ONS as she feels she is eating well enough at meals. Patient reports the food here does not have any taste. She would benefit from a liberalized diet to improve intake of calories and protein.  UBW 130 lbs. Daughter reports she was 124 lbs at an appointment last week. Patient reports she has lost approximately 6 lbs (4.6% body weight) over the past month, which is not quite significant for time frame. Current weight is reported and bed scale not working at this time to verify weight.  Meal Completion: 60% of dinner last night  Medications  reviewed and include: calcium citrate 500 mg/vitamin D 500 units 2 tablets daily, levothyroxine.  Labs reviewed: HgbA1c 6.5 on 09/11/2018.  Patient does not meet criteria for malnutrition at this time.  NUTRITION - FOCUSED PHYSICAL EXAM:    Most Recent Value  Orbital Region  No depletion  Upper Arm Region  Mild depletion  Thoracic and Lumbar Region  No depletion  Buccal Region  No depletion  Temple Region  Moderate depletion  Clavicle Bone Region  Mild depletion  Clavicle and Acromion Bone Region  Moderate depletion  Scapular Bone Region  Mild depletion  Dorsal Hand  Moderate depletion  Patellar Region  Mild depletion  Anterior Thigh Region  Mild depletion  Posterior Calf Region  No depletion  Edema (RD Assessment)  None  Hair  Reviewed  Eyes  Reviewed  Mouth  Reviewed  Skin  Reviewed  Nails  Reviewed     Diet Order:   Diet Order            Diet Heart Room service appropriate? Yes; Fluid consistency: Thin  Diet effective now              EDUCATION NEEDS:   Education needs have been addressed  Skin:  Skin Assessment: Reviewed RN Assessment  Last BM:  PTA (09/10/2018 per chart)  Height:   Ht Readings from Last 1 Encounters:  09/11/18 _0  (1.676 m)    Weight:   Wt Readings from Last 1 Encounters:  09/11/18 56.2 kg    Ideal Body Weight:  59.1 kg  BMI:  Body mass index is 20.01 kg/m.  Estimated Nutritional Needs:   Kcal:  1405-1685 (25-30 kcal/kg)  Protein:  65-80 grams (1.2-1.4 grams/kg)  Fluid:  1.4-1.7 L/day (1 mL/kcal)  Willey Blade, MS, RD, LDN Office: [phone] Pager: [phone] After Hours/Weekend Pager: [phone]

## 2018-09-12 NOTE — Consult Note (Signed)
Cardiology Consultation:   Patient ID: Jill Mitchell MRN: [id number]; DOB: [birth year]  Admit date: 09/11/2018 Date of Consult: 09/12/2018  Primary Care Provider: Leotis Shames, MD Primary Cardiologist: [facility] Primary Electrophysiologist:  None    Patient Profile:   Jill Mitchell is a 84 y.o. female with a hx of HTN, HLD, CKD3, hypothyroid, GERD, newly diagnosed DM2 who is being seen today for the evaluation of newly diagnosed Atrial fibrillation in the setting of acute stroke and at the request of Dr. Renae Gloss.  History of Present Illness:   Jill Mitchell is a 84 yo female with PMH as above and no known cardiac history and no previous diagnosis of Afib or arrhythmia. Past surgical h/o b/l removal of breast cysts and cataract extraction at unknown date. Family history significant for a mother with HF.  No current smoking, drug, or alcohol use.  9/24: Patient was reportedly brought to [facility] ED after not acting like herself and lacking orientation to place as well as reportedly not remembering how to work a microwave. Per patient report, the symptoms started when she woke around 8AM and worsened throughout the day. She reportedly called her daughter the morning of 09/11/18 and demonstrated aphasia and slurred speech. Per daughter's report, the patient did not demonstrate any of these sx and was in her usual state of health the previous night when they spoke at [facility]. No previous h/o speech problems or stroke. No recent falls or trauma. At baseline, the patient reportedly ambulates without difficulty and lives alone without assistance. Per documentation, she has experienced generalized weakness for the last week. Also, ED noted she was not a candidate for TPA by the time of presentation at [facility] (outside window) and admitted for further evaluation with cardiology consulted for new diagnosis of Afib.   In the ED 9/24, code stroke was activated and patient EKG significant for newly diagnosed Afib and  vitals significant for HTN. Vitals: BP 159/86, HR 93, RR 16, T 98.2, SpO2 96% Labs: glucose 207.  EKG: Afib with ventricular rate 88bpm, prolonged QTc Imaging confirms area of L parieto-occipital junction concerning for infarct.  Patient with intolerance of bruising to ASA but counseled by neuro re: risks v benefits of starting ASA in setting of the above with patient agreement and ASA started in the ED.    Per neurology, can start Eliquis in 5-7 days.  Past Medical History:  Diagnosis Date  . Hyperlipidemia   . Hypertension   . Hypothyroid     Past Surgical History:  Procedure Laterality Date  . BREAST EXCISIONAL BIOPSY Bilateral 1970   benign  . TONSILLECTOMY AND ADENOIDECTOMY       Home Medications:  Prior to Admission medications   Medication Sig Start Date End Date Taking? Authorizing Provider  amLODipine (NORVASC) 10 MG tablet Take 10 mg by mouth daily.  01/11/17  Yes [provider]  bisoprolol-hydrochlorothiazide (ZIAC) 2.5-6.25 MG tablet Take 1 tablet by mouth daily.  03/06/18 03/06/19 Yes [provider]  calcium citrate-vitamin D (CITRACAL+D) 315-200 MG-UNIT tablet Take 2 tablets by mouth daily.    Yes [provider]  cyanocobalamin (,VITAMIN B-12,) 1000 MCG/ML injection Inject 1,000 mcg into the muscle every 30 (thirty) days.   Yes [provider]  fluticasone (FLONASE) 50 MCG/ACT nasal spray Place 2 sprays into both nostrils daily.   Yes [provider]  levothyroxine (SYNTHROID, LEVOTHROID) 75 MCG tablet Take 75 mcg by mouth daily.  01/11/17  Yes [provider]  loratadine (  CLARITIN) 10 MG tablet Take 10 mg by mouth daily.   Yes [provider]  LORazepam (ATIVAN) 1 MG tablet Take 0.5 mg by mouth at bedtime as needed for anxiety.  05/22/17  Yes [provider]  rosuvastatin (CRESTOR) 10 MG tablet Take 10 mg by mouth daily.  10/21/16  Yes [provider]    Inpatient Medications: Scheduled  Meds: . aspirin EC  325 mg Oral Daily  . bisoprolol-hydrochlorothiazide  1 tablet Oral Daily  . calcium citrate-vitamin D  2 tablet Oral Daily  . enoxaparin (LOVENOX) injection  40 mg Subcutaneous Q24H  . fluticasone  2 spray Each Nare Daily  . levothyroxine  75 mcg Oral QAC breakfast  . loratadine  10 mg Oral Daily  . rosuvastatin  20 mg Oral Daily   Continuous Infusions:  PRN Meds: LORazepam, ondansetron (ZOFRAN) IV  Allergies:    Allergies  Allergen Reactions  . Aspirin Other (See Comments)    Causes bruising  . Atorvastatin Nausea Only  . Cholestyramine Nausea Only  . Codeine Other (See Comments)  . Lovastatin Nausea Only    GI upset  . Niacin Other (See Comments)    ineffective  . Penicillins Other (See Comments)  . Pravastatin Nausea Only  . Ramipril Other (See Comments)  . Simvastatin Nausea Only    Social History:   Social History   Socioeconomic History  . Marital status: Widowed    Spouse name: Not on file  . Number of children: Not on file  . Years of education: Not on file  . Highest education level: Not on file  Occupational History  . Not on file  Social Needs  . Financial resource strain: Not on file  . Food insecurity:    Worry: Not on file    Inability: Not on file  . Transportation needs:    Medical: Not on file    Non-medical: Not on file  Tobacco Use  . Smoking status: Never Smoker  . Smokeless tobacco: Never Used  Substance and Sexual Activity  . Alcohol use: Not Currently  . Drug use: Never  . Sexual activity: Not on file  Lifestyle  . Physical activity:    Days per week: Not on file    Minutes per session: Not on file  . Stress: Not on file  Relationships  . Social connections:    Talks on phone: Not on file    Gets together: Not on file    Attends religious service: Not on file    Active member of club or organization: Not on file    Attends meetings of clubs or organizations: Not on file    Relationship status: Not on  file  . Intimate partner violence:    Fear of current or ex partner: Not on file    Emotionally abused: Not on file    Physically abused: Not on file    Forced sexual activity: Not on file  Other Topics Concern  . Not on file  Social History Narrative  . Not on file    Family History:   Patient thinks she may have a sister with h/o arrhythmia Mother with MI in 29s. Father with MI in 69s. Will update FHx tab.   Family History  Problem Relation Age of Onset  . Breast cancer Sister 73  . Breast cancer Paternal Aunt 47  . CAD Mother      ROS:  Please see the history of present illness.   All other  ROS reviewed and negative.     Physical Exam/Data:   Vitals:   09/12/18 0113 09/12/18 0325 09/12/18 0501 09/12/18 0652  BP: 126/75 (!) 146/71 140/64 139/66  Pulse: 64 71 63 68  Resp: 16 16 20 16   Temp: 97.9 F (36.6 C) 97.7 F (36.5 C) 98.3 F (36.8 C) 98.4 F (36.9 C)  TempSrc: Oral Oral Oral Oral  SpO2: 93% 96% 95% 94%  Weight:      Height:        Intake/Output Summary (Last 24 hours) at 09/12/2018 1029 Last data filed at 09/12/2018 0900 Gross per 24 hour  Intake 480 ml  Output -  Net 480 ml   Filed Weights   09/11/18 1147  Weight: 56.2 kg   Body mass index is 20.01 kg/m.  General:  Frail elderly woman in no acute distress and accompanied by daughter and son in the room at the time of exam. Patient with some memory recall issues & occasionally has trouble finding words and using incorrect pronoun non agreement but otherwise AAOx3 HEENT: normal Lymph: no adenopathy Neck: no JVD Endocrine:  No thryomegaly Vascular: no carotid bruits; FA pulses 2+ bilaterally without bruits  Cardiac:  IRIR normal S1, S2; ; no murmur  Lungs:  clear to auscultation bilaterally, no wheezing, rhonchi or rales  Abd: soft, nontender, no hepatomegaly  Ext: no edema Musculoskeletal:  No deformities, BUE and BLE strength normal and equal Skin: warm and dry  Psych:  Normal affect. Some  memory issues as noted above  EKG: Refer to HPI Telemetry:  Telemetry was personally reviewed and demonstrates:  IRIR / Afib with ventricular rates 70-80s and occasional PVCs  CV Studies:   Relevant CV Studies: Pending 9/24 echo results  9/24 US Carotid B/l IMPRESSION: Color duplex indicates minimal heterogeneous and calcified plaque, with no hemodynamically significant stenosis by duplex criteria in the extracranial cerebrovascular circulation.  Laboratory Data:  Chemistry Recent Labs  Lab 09/11/18 1159 09/12/18 0426  NA 137 136  K 3.5 3.8  CL 101 105  CO2 26 23  GLUCOSE 207* 125*  BUN 15 12  CREATININE 0.91 0.59  CALCIUM 9.3 8.7*  GFRNONAA 53* >60  GFRAA >60 >60  ANIONGAP 10 8    Recent Labs  Lab 09/11/18 1159  PROT 7.6  ALBUMIN 4.2  AST 25  ALT 14  ALKPHOS 59  BILITOT 0.6   Hematology Recent Labs  Lab 09/11/18 1159 09/12/18 0426  WBC 9.0 8.6  RBC 4.42 4.23  HGB 14.4 13.5  HCT 41.3 39.6  MCV 93.4 93.5  MCH 32.6 31.8  MCHC 34.9 34.0  RDW 13.2 12.9  PLT 393 376   Cardiac Enzymes Recent Labs  Lab 09/11/18 1159  TROPONINI <0.03   No results for input(s): TROPIPOC in the last 168 hours.  BNPNo results for input(s): BNP, PROBNP in the last 168 hours.  DDimer No results for input(s): DDIMER in the last 168 hours.  Radiology/Studies:  Ct Angio Head W Or Wo Contrast  Result Date: 09/11/2018 CLINICAL DATA:  Confusion and altered mental status. Trouble finding words. EXAM: CT ANGIOGRAPHY HEAD AND NECK CT PERFUSION BRAIN TECHNIQUE: Multidetector CT imaging of the head and neck was performed using the standard protocol during bolus administration of intravenous contrast. Multiplanar CT image reconstructions and MIPs were obtained to evaluate the vascular anatomy. Carotid stenosis measurements (when applicable) are obtained utilizing NASCET criteria, using the distal internal carotid diameter as the denominator. Multiphase CT imaging of the brain was  performed following IV bolus contrast injection. Subsequent parametric perfusion maps were calculated using RAPID software. CONTRAST:  OMNIPAQUE IOHEXOL 350 MG/ML SOLN COMPARISON:  Head CT from earlier today FINDINGS: CTA NECK FINDINGS Aortic arch: Atherosclerotic plaque. Three vessel branching. No aneurysm or dissection. Right carotid system: Calcified plaque at the right ICA bulb with 50% stenosis on coronal reformats. No ulceration. No beading. Left carotid system: Moderate calcified plaque at the common carotid bifurcation without stenosis or ulceration. ICA tortuosity. Vertebral arteries: Proximal subclavian atherosclerosis without flow limiting stenosis. There is plaque at the origin of the right vertebral artery with mild stenosis. Tortuous vertebral arteries without evidence of dissection or beading. Skeleton: Advanced degenerative disease. Other neck: No evidence of mass or inflammation. Upper chest: No acute finding. Review of the MIP images confirms the above findings CTA HEAD FINDINGS Anterior circulation: Left M4 branch occlusion marked on series 7. The level of occlusion correlates with the size of infarct by CT. There is atherosclerotic plaque on the carotid siphons. Wide necked superior hypophyseal region aneurysm from the ICA on the left measuring 4 x 3 mm. Posterior circulation: Symmetric vertebral arteries. The vertebral and basilar arteries are smooth and widely patent. No branch occlusion or beading. Venous sinuses: Patent Anatomic variants: None significant Delayed phase: Not obtained in the emergent setting Review of the MIP images confirms the above findings CT Brain Perfusion Findings: CBF (<30%) Volume: 17mL-but infarct is present infarct in the left parietal region by CT. Perfusion (Tmax>6.0s) volume: 57mL-matching the infarct by CT A page has been placed to Dr. Thad Ranger. IMPRESSION: 1. Completed 9 cc left parietal infarct which correlates well with a left M4 branch occlusion. 2. No  causative flow limiting stenosis or embolic source identified. 3. 50% atheromatous narrowing of the right right ICA bulb. 4. 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 Electronically Signed   By: Marnee Spring M.D.   On: 09/11/2018 13:18   Ct Head Wo Contrast  Result Date: 09/11/2018 CLINICAL DATA:  Confusion and dysarthria EXAM: CT HEAD WITHOUT CONTRAST TECHNIQUE: Contiguous axial images were obtained from the base of the skull through the vertex without intravenous contrast. COMPARISON:  None. FINDINGS: Brain: There is mild diffuse atrophy. There is no intracranial mass, hemorrhage, extra-axial fluid collection, or midline shift. There is focal decreased attenuation with appearance sulcal effacement at the junction of the left superficial posterior temporal, superficial lateral occipital, and inferior parietal lobes. This area is felt to represent a likely acute infarct. Elsewhere there is relatively mild patchy small vessel disease in the centra semiovale bilaterally. Vascular: There is a subtle hyperdense vessel involving a portion of the M2 segment of the left middle cerebral artery. There is no appreciable vascular calcification. Skull: Bony calvarium appears intact. Sinuses/Orbits: There is mucosal thickening in several ethmoid air cells. Other visualized paranasal sinuses are clear. Visualized orbits appear symmetric bilaterally. Evidence of previous cataract removal on the left. Other: Mastoid air cells are clear. IMPRESSION: 1. Evidence of acute infarct at the junction of the left occipital, temporal, and parietal lobes. In this area, there is focal decreased attenuation in localized sulcal effacement. 2. Area hyperdense vessel involving a portion of the M2 segment of the left middle cerebral artery. 3. Atrophy with mild periventricular small vessel disease. No hemorrhage. 4.  Mucosal thickening in several ethmoid air cells. These results were called by telephone at the time of  interpretation on 09/11/2018 at 12:11 pm to Dr. Sharman Cheek , who verbally acknowledged these results. Electronically  Signed   By: Bretta Bang III M.D.   On: 09/11/2018 12:12   Ct Angio Neck W Or Wo Contrast  Result Date: 09/11/2018 CLINICAL DATA:  Confusion and altered mental status. Trouble finding words. EXAM: CT ANGIOGRAPHY HEAD AND NECK CT PERFUSION BRAIN TECHNIQUE: Multidetector CT imaging of the head and neck was performed using the standard protocol during bolus administration of intravenous contrast. Multiplanar CT image reconstructions and MIPs were obtained to evaluate the vascular anatomy. Carotid stenosis measurements (when applicable) are obtained utilizing NASCET criteria, using the distal internal carotid diameter as the denominator. Multiphase CT imaging of the brain was performed following IV bolus contrast injection. Subsequent parametric perfusion maps were calculated using RAPID software. CONTRAST:  OMNIPAQUE IOHEXOL 350 MG/ML SOLN COMPARISON:  Head CT from earlier today FINDINGS: CTA NECK FINDINGS Aortic arch: Atherosclerotic plaque. Three vessel branching. No aneurysm or dissection. Right carotid system: Calcified plaque at the right ICA bulb with 50% stenosis on coronal reformats. No ulceration. No beading. Left carotid system: Moderate calcified plaque at the common carotid bifurcation without stenosis or ulceration. ICA tortuosity. Vertebral arteries: Proximal subclavian atherosclerosis without flow limiting stenosis. There is plaque at the origin of the right vertebral artery with mild stenosis. Tortuous vertebral arteries without evidence of dissection or beading. Skeleton: Advanced degenerative disease. Other neck: No evidence of mass or inflammation. Upper chest: No acute finding. Review of the MIP images confirms the above findings CTA HEAD FINDINGS Anterior circulation: Left M4 branch occlusion marked on series 7. The level of occlusion correlates with the size of  infarct by CT. There is atherosclerotic plaque on the carotid siphons. Wide necked superior hypophyseal region aneurysm from the ICA on the left measuring 4 x 3 mm. Posterior circulation: Symmetric vertebral arteries. The vertebral and basilar arteries are smooth and widely patent. No branch occlusion or beading. Venous sinuses: Patent Anatomic variants: None significant Delayed phase: Not obtained in the emergent setting Review of the MIP images confirms the above findings CT Brain Perfusion Findings: CBF (<30%) Volume: 92mL-but infarct is present infarct in the left parietal region by CT. Perfusion (Tmax>6.0s) volume: 4mL-matching the infarct by CT A page has been placed to Dr. Thad Ranger. IMPRESSION: 1. Completed 9 cc left parietal infarct which correlates well with a left M4 branch occlusion. 2. No causative flow limiting stenosis or embolic source identified. 3. 50% atheromatous narrowing of the right right ICA bulb. 4. 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 Electronically Signed   By: Marnee Spring M.D.   On: 09/11/2018 13:18   Mr Brain Wo Contrast  Result Date: 09/12/2018 CLINICAL DATA:  Follow-up examination for acute stroke. EXAM: MRI HEAD WITHOUT CONTRAST TECHNIQUE: Multiplanar, multiecho pulse sequences of the brain and surrounding structures were obtained without intravenous contrast. COMPARISON:  Comparison made with prior CTs from earlier the same day. FINDINGS: Brain: Diffuse prominence of the CSF containing spaces compatible with generalized age-related cerebral atrophy. Patchy and confluent T2/FLAIR hyperintensity within the periventricular and deep white matter both cerebral hemispheres most consistent with chronic small vessel ischemic disease, moderate nature. Tiny remote right cerebellar infarct noted. Area of restricted diffusion involving the cortical gray matter of the left parieto-occipital region consistent with acute to early subacute ischemic infarct faint petechial  hemorrhage without hemorrhagic transformation. No other evidence for acute or subacute ischemia. No mass lesion, midline shift or mass effect. No hydrocephalus. No extra-axial fluid collection. Normal pituitary gland. Vascular: Major intracranial vascular flow voids maintained. Skull and upper cervical  spine: Craniocervical junction within normal limits. Degenerative spondylolysis at C3-4 without significant stenosis noted. No focal marrow replacing lesion. Scalp soft tissues unremarkable. Sinuses/Orbits: Patient status post ocular lens replacement on the left. Paranasal sinuses are clear. Trace bilateral mastoid effusions, of doubtful significance. Other: None. IMPRESSION: 1. Acute to early subacute left parieto-occipital cortical infarct. Associated faint petechial hemorrhage without hemorrhagic transformation. 2. Underlying age-related cerebral atrophy with mild to moderate chronic small vessel ischemic disease. Electronically Signed   By: Rise Mu M.D.   On: 09/12/2018 01:24   US Carotid Bilateral (at [facility] And Ap Only)  Result Date: 09/11/2018 CLINICAL DATA:  84 year old female with a history of stroke. EXAM: BILATERAL CAROTID DUPLEX ULTRASOUND TECHNIQUE: Wallace Cullens scale imaging, color Doppler and duplex ultrasound were performed of bilateral carotid and vertebral arteries in the neck. COMPARISON:  None. FINDINGS: Criteria: Quantification of carotid stenosis is based on velocity parameters that correlate the residual internal carotid diameter with NASCET-based stenosis levels, using the diameter of the distal internal carotid lumen as the denominator for stenosis measurement. The following velocity measurements were obtained: RIGHT ICA:  Systolic 120 cm/sec, Diastolic 35 cm/sec CCA:  68 cm/sec SYSTOLIC ICA/CCA RATIO:  1.8 ECA:  101 cm/sec LEFT ICA:  Systolic 86 cm/sec, Diastolic 28 cm/sec CCA:  82 cm/sec SYSTOLIC ICA/CCA RATIO:  1.0 ECA:  70 cm/sec Right Brachial SBP: Not acquired Left Brachial SBP:  Not acquired RIGHT CAROTID ARTERY: No significant calcifications of the right common carotid artery. Intermediate waveform maintained. Heterogeneous and partially calcified plaque at the right carotid bifurcation. No significant lumen shadowing. Low resistance waveform of the right ICA. Mild tortuosity RIGHT VERTEBRAL ARTERY: Antegrade flow with low resistance waveform. LEFT CAROTID ARTERY: No significant calcifications of the left common carotid artery. Intermediate waveform maintained. Heterogeneous and partially calcified plaque at the left carotid bifurcation without significant lumen shadowing. Low resistance waveform of the left ICA. Mild tortuosity LEFT VERTEBRAL ARTERY:  Antegrade flow with low resistance waveform. IMPRESSION: Color duplex indicates minimal heterogeneous and calcified plaque, with no hemodynamically significant stenosis by duplex criteria in the extracranial cerebrovascular circulation. Signed, Yvone Neu. Reyne Dumas, RPVI [facility] Electronically Signed   By: Gilmer Mor D.O.   On: 09/11/2018 15:20   Ct Cerebral Perfusion W Contrast  Result Date: 09/11/2018 CLINICAL DATA:  Confusion and altered mental status. Trouble finding words. EXAM: CT ANGIOGRAPHY HEAD AND NECK CT PERFUSION BRAIN TECHNIQUE: Multidetector CT imaging of the head and neck was performed using the standard protocol during bolus administration of intravenous contrast. Multiplanar CT image reconstructions and MIPs were obtained to evaluate the vascular anatomy. Carotid stenosis measurements (when applicable) are obtained utilizing NASCET criteria, using the distal internal carotid diameter as the denominator. Multiphase CT imaging of the brain was performed following IV bolus contrast injection. Subsequent parametric perfusion maps were calculated using RAPID software. CONTRAST:  OMNIPAQUE IOHEXOL 350 MG/ML SOLN COMPARISON:  Head CT from earlier today  FINDINGS: CTA NECK FINDINGS Aortic arch: Atherosclerotic plaque. Three vessel branching. No aneurysm or dissection. Right carotid system: Calcified plaque at the right ICA bulb with 50% stenosis on coronal reformats. No ulceration. No beading. Left carotid system: Moderate calcified plaque at the common carotid bifurcation without stenosis or ulceration. ICA tortuosity. Vertebral arteries: Proximal subclavian atherosclerosis without flow limiting stenosis. There is plaque at the origin of the right vertebral artery with mild stenosis. Tortuous vertebral arteries without evidence of dissection or beading. Skeleton: Advanced degenerative disease. Other neck: No evidence of  mass or inflammation. Upper chest: No acute finding. Review of the MIP images confirms the above findings CTA HEAD FINDINGS Anterior circulation: Left M4 branch occlusion marked on series 7. The level of occlusion correlates with the size of infarct by CT. There is atherosclerotic plaque on the carotid siphons. Wide necked superior hypophyseal region aneurysm from the ICA on the left measuring 4 x 3 mm. Posterior circulation: Symmetric vertebral arteries. The vertebral and basilar arteries are smooth and widely patent. No branch occlusion or beading. Venous sinuses: Patent Anatomic variants: None significant Delayed phase: Not obtained in the emergent setting Review of the MIP images confirms the above findings CT Brain Perfusion Findings: CBF (<30%) Volume: 71mL-but infarct is present infarct in the left parietal region by CT. Perfusion (Tmax>6.0s) volume: 36mL-matching the infarct by CT A page has been placed to Dr. Thad Ranger. IMPRESSION: 1. Completed 9 cc left parietal infarct which correlates well with a left M4 branch occlusion. 2. No causative flow limiting stenosis or embolic source identified. 3. 50% atheromatous narrowing of the right right ICA bulb. 4. 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 Electronically Signed   By: Marnee Spring M.D.   On: 09/11/2018 13:18    Assessment and Plan:   Atrial Fibrillation in setting of stroke - No known past h/o arrhythmia.  - Ischemic stroke likely d/t embolic cause in setting of newly diagnosed Afib. Pending results of echo. Carotid US as above. Per neuro, OK to start anticoagulation in 5-7 days to prevent hemorrhagic conversion.  - CHA2DS2VASc score of at least 7 (HTN, agex2, strokex2, female, DM2).  - EKG coarse atrial fib, QTc 505. Caution with QT prolonging medications.  - 04/10/2018 TSH 2.695; Cr  0.59; 9/25 K 3.8, Mg 2.6.  -  Continue ASA, bisoprolol HCTZ 2.5-6.25mg  qd, lovenox 40mg  injection, and Crestor. Medication intolerances have been noted as documented in Epic and confirmed by patient. - Plan for rate control at this time and given patient will need to wait 5-7 days before start of anticoagulation. Patient will need long term anticoagulation at discharge given above CHA2DS2VASc score of at least 7. Given patient age, likely will opt for rate control only and discussed this with family members at the time of physical exam.  HLD, uncontrolled - Intolerance reported to statins - Continue home meds with Crestor 20mg . LDL uncontrolled. - Consider PCSK9 inhibitor / Repatha if medication compliance on Crestor with continued uncontrolled LDL. Consider referral to lipid clinic -04/10/2018 LDL 88  110 on 9/25 with goal <70.   HTN - Hypertensive at presentation to ED - Continue to monitor vitals with titration of anti-hypertensives as HR and BP allow  CKD stage 3  - CareEverywhere / previous dx  - Renal function currently stable - Cr 0.59, BUN 12. Hgb 13.5.   New Onset DM - Recommendation for carb modified diet. - Per IM  Hypothyroid - Continue home medication of synthroid - TSH 2.695 - Per IM  Remaining per IM   For questions or updates, please contact [facility] Please consult [email or web address] for contact info under     Signed, Lennon Alstrom, PA-C    09/12/2018 10:29 AM

## 2018-09-12 NOTE — Care Management CC44 (Signed)
Condition Code 44 Documentation Completed  Patient Details  Name: Jill Mitchell MRN: [id number] Date of Birth: October 27, 1923   Condition Code 44 given:    Patient signature on Condition Code 44 notice:    Documentation of 2 MD's agreement:    Code 44 added to claim:       Gwenette Greet, RN 09/12/2018, 3:21 PM

## 2018-09-12 NOTE — Progress Notes (Signed)
SLP Cancellation Note  Patient Details Name: Jill Mitchell MRN: [id number] DOB: 1923-11-14   Cancelled treatment:       Reason Eval/Treat Not Completed: (chart reviewed; consulted NSG then met briefly w/ pt/family). Breakfast tray was just arriving as was family. Per Neurology note this morning, pt is awake, alert, and oriented x4, speech is much improved from previously per pt and family. Daughter at bedside, she report that patient's speech is much better. Pt was confused w/ the date of her birthdate but otherwise engaged in some conversation. ST services will f/u w/ pt's status later today or tomorrow as needed. Also encouraged pt/family to f/u w/ PCP if upon returning home to her usual routine any speech-language deficits are noted. Also encouraged her to discuss returning to driving a motor vehicle w/ the Neurologist and PCP d/t MRI revealing an Acute to early subacute left parieto-occipital cortical infarct; age-related cerebral atrophy with mild to moderate chronic small vessel ischemic disease. Family, pt agreed. NSG updated.     Orinda Kenner, MS, CCC-SLP Suhaan Perleberg 09/12/2018, 11:21 AM

## 2018-09-12 NOTE — Evaluation (Addendum)
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 History includes HTN and hyperlipidemia.    Clinical Impression   Pt seen for OT evaluation this date. Prior to hospital admission, pt was independent in all aspects of ADL/IADL and denies falls history in past 12 months. Pt lives alone in a 1 story home with 2 steps to enter. Currently pt reporting symptoms have resolved. Pt demonstrates baseline independence to perform ADL and mobility tasks and no strength, sensory, coordination, cognitive, or visual deficits appreciated with assessment. No skilled OT needs identified. Will sign off. Please re-consult if additional OT needs arise. Recommend family to provide remote supervision initially to ensure safety/smooth transition back to home and daily routines.     Follow Up Recommendations  Supervision - Intermittent;No OT follow up    Equipment Recommendations  Tub/shower seat    Recommendations for Other Services       Precautions / Restrictions Precautions Precautions: Fall Restrictions Weight Bearing Restrictions: No      Mobility Bed Mobility Overal bed mobility: Independent             General bed mobility comments: safe technique and ease of mobility sitting on EOB.  Transfers Overall transfer level: Independent Equipment used: None             General transfer comment: safe technique with upright posture    Balance Overall balance assessment: Mild deficits observed, not formally tested                                      ADL either performed or assessed with clinical judgement   ADL Overall ADL's : At baseline;Independent                                       General ADL Comments: generally at baseline independence, no  difficulty or assist required for ADL     Vision Baseline Vision/History: No visual deficits Patient Visual Report: No change from baseline Vision Assessment?: No apparent visual deficits Additional Comments: no deficits noted with tracking, quadrant, and peripheral testing     Perception     Praxis      Pertinent Vitals/Pain Pain Assessment: No/denies pain     Hand Dominance Right   Extremity/Trunk Assessment Upper Extremity Assessment Upper Extremity Assessment: Overall WFL for tasks assessed   Lower Extremity Assessment Lower Extremity Assessment: Overall WFL for tasks assessed   Cervical / Trunk Assessment Cervical / Trunk Assessment: Normal   Communication Communication Communication: No difficulties   Cognition Arousal/Alertness: Awake/alert Behavior During Therapy: WFL for tasks assessed/performed Overall Cognitive Status: Impaired/Different from baseline Area of Impairment: Memory                               General Comments: was oriented x 3, couldn't recall birthday, follows all commands well   General Comments       Exercises Other Exercises Other Exercises: pt/family educated in AE/DME for home to improve safety and independence with tub transfers and showering Other Exercises: pt/family educated in falls prevention and environmental set up to support energy conservation  Shoulder Instructions      Home Living Family/patient expects to be discharged to:: Private residence Living Arrangements: Alone Available Help at Discharge: Family(avaliable to stay with her at discharge) Type of Home: House Home Access: Level entry     Home Layout: One level     Bathroom Shower/Tub: Chief Strategy Officer: Standard     Home Equipment: None          Prior Functioning/Environment Level of Independence: Independent        Comments: active and independent prior to admission        OT Problem List:        OT  Treatment/Interventions:      OT Goals(Current goals can be found in the care plan section) Acute Rehab OT Goals Patient Stated Goal: to get back home OT Goal Formulation: All assessment and education complete, DC therapy  OT Frequency:     Barriers to D/C:            Co-evaluation              AM-PAC PT "6 Clicks" Daily Activity     Outcome Measure Help from another person eating meals?: None Help from another person taking care of personal grooming?: None Help from another person toileting, which includes using toliet, bedpan, or urinal?: None Help from another person bathing (including washing, rinsing, drying)?: None Help from another person to put on and taking off regular upper body clothing?: None Help from another person to put on and taking off regular lower body clothing?: None 6 Click Score: 24   End of Session    Activity Tolerance: Patient tolerated treatment well Patient left: in bed;with call bell/phone within reach;with family/visitor present;with chair alarm set  OT Visit Diagnosis: Other abnormalities of gait and mobility (R26.89)                Time: 0981-1914 OT Time Calculation (min): 12 min Charges:  OT General Charges $OT Visit: 1 Visit OT Evaluation $OT Eval Low Complexity: 1 Low  Richrd Prime, MPH, MS, OTR/L ascom [phone] 09/12/18, 3:20 PM

## 2018-09-12 NOTE — Care Management Obs Status (Signed)
MEDICARE OBSERVATION STATUS NOTIFICATION   Patient Details  Name: Jill Mitchell MRN: [id number] Date of Birth: 21-Apr-1923   Medicare Observation Status Notification Given:  Yes    Gwenette Greet, RN 09/12/2018, 3:20 PM

## 2018-09-12 NOTE — Evaluation (Signed)
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 History includes HTN and hyperlipidemia.   Clinical Impression  Pt is a pleasant 84 year old female who was admitted for positive CVA. Pt demonstrates all bed mobility/transfers/ambulation at baseline level. Scored 12/12 on mod DGI, however does show some unsteadiness with ambulation, baseline per family. No sensation deficits and coordination testing WNL. Pt does not require any further PT needs at this time. Pt will be dc in house and does not require follow up. RN aware. Will dc current orders.      Follow Up Recommendations No PT follow up    Equipment Recommendations  None recommended by PT    Recommendations for Other Services       Precautions / Restrictions Precautions Precautions: Fall Restrictions Weight Bearing Restrictions: No      Mobility  Bed Mobility Overal bed mobility: Independent             General bed mobility comments: safe technique and ease of mobility sitting on EOB.  Transfers Overall transfer level: Independent Equipment used: None             General transfer comment: safe technique with upright posture  Ambulation/Gait Ambulation/Gait assistance: Min guard Gait Distance (Feet): 200 Feet Assistive device: None Gait Pattern/deviations: Step-through pattern     General Gait Details: ambulated using reciprocal gait pattern and no AD. SLightly off balance during ambulation, however no formal LOB. Able to carry conversation during ambulation as well as perform modified DGI.  Stairs            Wheelchair Mobility    Modified Rankin (Stroke Patients Only)       Balance Overall balance assessment: Mild deficits observed, not formally tested                               Standardized Balance Assessment Standardized Balance Assessment : Dynamic Gait Index   Dynamic Gait Index Level Surface: Normal Change in Gait Speed: Normal Gait with Horizontal Head Turns: Normal Gait with Vertical Head Turns: Normal       Pertinent Vitals/Pain Pain Assessment: No/denies pain    Home Living Family/patient expects to be discharged to:: Private residence Living Arrangements: Alone Available Help at Discharge: Family(avaliable to stay with her at discharge) Type of Home: House Home Access: Level entry     Home Layout: One level Home Equipment: None      Prior Function Level of Independence: Independent         Comments: active and independent prior to admission     Hand Dominance        Extremity/Trunk Assessment   Upper Extremity Assessment Upper Extremity Assessment: Overall WFL for tasks assessed    Lower Extremity Assessment Lower Extremity Assessment: Overall WFL for tasks assessed       Communication   Communication: No difficulties  Cognition Arousal/Alertness: Awake/alert Behavior During Therapy: WFL for tasks assessed/performed Overall Cognitive Status: Impaired/Different from baseline Area of Impairment: Memory                               General Comments: was oriented x 3, couldn't recall birthday      General Comments  Exercises     Assessment/Plan    PT Assessment Patent does not need any further PT services  PT Problem List         PT Treatment Interventions      PT Goals (Current goals can be found in the Care Plan section)  Acute Rehab PT Goals Patient Stated Goal: to get back home PT Goal Formulation: All assessment and education complete, DC therapy Time For Goal Achievement: 09/12/18 Potential to Achieve Goals: Good    Frequency     Barriers to discharge        Co-evaluation               AM-PAC PT "6 Clicks" Daily Activity  Outcome Measure Difficulty turning over  in bed (including adjusting bedclothes, sheets and blankets)?: None Difficulty moving from lying on back to sitting on the side of the bed? : None Difficulty sitting down on and standing up from a chair with arms (e.g., wheelchair, bedside commode, etc,.)?: None Help needed moving to and from a bed to chair (including a wheelchair)?: None Help needed walking in hospital room?: None Help needed climbing 3-5 steps with a railing? : None 6 Click Score: 24    End of Session Equipment Utilized During Treatment: Gait belt Activity Tolerance: Patient tolerated treatment well Patient left: in bed;with nursing/sitter in room;with family/visitor present Nurse Communication: Mobility status PT Visit Diagnosis: Muscle weakness (generalized) (M62.81)    Time: 1030-1047 PT Time Calculation (min) (ACUTE ONLY): 17 min   Charges:   PT Evaluation $PT Eval Low Complexity: 1 Low PT Treatments $Gait Training: 8-22 mins        Elizabeth Palau, PT, DPT [phone]   Kisa Fujii 09/12/2018, 1:27 PM

## 2018-09-12 NOTE — Care Management Important Message (Signed)
Important Message  Patient Details  Name: DEYANNA MCTIER MRN: [id number] Date of Birth: December 21, 1922   Medicare Important Message Given:       Gwenette Greet, RN 09/12/2018, 3:20 PM

## 2018-09-13 NOTE — Discharge Summary (Signed)
[employer] [facility] [place] [facility]   PATIENT NAME: Jill Mitchell    MR#:  [id number]  DATE OF BIRTH:  25-Nov-1923  DATE OF ADMISSION:  09/11/2018   ADMITTING PHYSICIAN: Alford Highland, MD  DATE OF DISCHARGE: 09/12/2018  5:20 PM  PRIMARY CARE PHYSICIAN: Leotis Shames, MD   ADMISSION DIAGNOSIS:   Stroke [facility]) [I63.9] New onset atrial fibrillation (HCC) [I48.91] Acute ischemic stroke (HCC) [I63.9]  DISCHARGE DIAGNOSIS:   Active Problems:   Stroke (HCC)   Acute ischemic stroke (HCC)   SECONDARY DIAGNOSIS:   Past Medical History:  Diagnosis Date  . Hyperlipidemia   . Hypertension   . Hypothyroid     HOSPITAL COURSE:   84 year old lady with past medical history significant for hypertension, hyperlipidemia, CKD stage III, hypothyroidism, diabetes presents to hospital secondary to slurred speech and confusion.  1.  Acute CVA-presented as slurred speech and confusion.  Resolved in the hospital. -MRI of the brain consistent with acute left parieto-occipital infarct -Started on aspirin in the hospital until 1 more week and then will be transitioned to Eliquis. -Already on statin, dose adjusted. -Appreciate neurology consult.  CT angiogram carotids did not show any hemodynamically significant stenosis. -New onset A. fib diagnosed in the hospital.  Will be started on Eliquis within a week due to acute stroke -Echo without any thrombus -No deficits noted on exam.  Worked well with PT and OT and no needs identified.  2.  New onset A. fib-especially in the setting of stroke, needs to be anticoagulated. -Eliquis can be restarted within 1 week after discharge.  Will be on aspirin until then -Rate well controlled.  Patient already on bisoprolol.  3.  Hypertension-on bisoprolol/hydrochlorothiazide  4.  Hypothyroidism-continue Synthroid  Patient is ambulating well.  Plan of care explained with family and they are in agreement.  DISCHARGE CONDITIONS:    Guarded  CONSULTS OBTAINED:   Treatment Team:  Kym Groom, MD Thana Farr, MD Antonieta Iba, MD  DRUG ALLERGIES:   Allergies  Allergen Reactions  . Aspirin Other (See Comments)    Causes bruising  . Atorvastatin Nausea Only  . Cholestyramine Nausea Only  . Codeine Other (See Comments)  . Lovastatin Nausea Only    GI upset  . Niacin Other (See Comments)    ineffective  . Penicillins Other (See Comments)  . Pravastatin Nausea Only  . Ramipril Other (See Comments)  . Simvastatin Nausea Only   DISCHARGE MEDICATIONS:   Allergies as of 09/12/2018      Reactions   Aspirin Other (See Comments)   Causes bruising   Atorvastatin Nausea Only   Cholestyramine Nausea Only   Codeine Other (See Comments)   Lovastatin Nausea Only   GI upset   Niacin Other (See Comments)   ineffective   Penicillins Other (See Comments)   Pravastatin Nausea Only   Ramipril Other (See Comments)   Simvastatin Nausea Only      Medication List    STOP taking these medications   amLODipine 10 MG tablet Commonly known as:  NORVASC   bisoprolol-hydrochlorothiazide 2.5-6.25 MG tablet Commonly known as:  ZIAC Replaced by:  bisoprolol-hydrochlorothiazide 5-6.25 MG tablet     TAKE these medications   apixaban 2.5 MG Tabs tablet Commonly known as:  ELIQUIS Take 1 tablet (2.5 mg total) by mouth 2 (two) times daily. Start taking on:  09/17/2018   aspirin 325 MG EC tablet Take 1 tablet (325 mg total) by mouth daily for 5 days.  bisoprolol-hydrochlorothiazide 5-6.25 MG tablet Commonly known as:  ZIAC Take 1 tablet by mouth daily. Replaces:  bisoprolol-hydrochlorothiazide 2.5-6.25 MG tablet   calcium citrate-vitamin D 315-200 MG-UNIT tablet Commonly known as:  CITRACAL+D Take 2 tablets by mouth daily.   cyanocobalamin 1000 MCG/ML injection Commonly known as:  (VITAMIN B-12) Inject 1,000 mcg into the muscle every 30 (thirty) days.   fluticasone 50 MCG/ACT nasal  spray Commonly known as:  FLONASE Place 2 sprays into both nostrils daily.   levothyroxine 75 MCG tablet Commonly known as:  SYNTHROID, LEVOTHROID Take 75 mcg by mouth daily.   loratadine 10 MG tablet Commonly known as:  CLARITIN Take 10 mg by mouth daily.   LORazepam 1 MG tablet Commonly known as:  ATIVAN Take 0.5 mg by mouth at bedtime as needed for anxiety.   rosuvastatin 20 MG tablet Commonly known as:  CRESTOR Take 1 tablet (20 mg total) by mouth daily. What changed:    medication strength  how much to take        DISCHARGE INSTRUCTIONS:   1.  PCP follow-up in 1 to 2 weeks 2.  Neurology follow-up in 1 to 2 weeks 2.  Cardiology follow-up in 2 weeks  DIET:   Cardiac diet  ACTIVITY:   Activity as tolerated  OXYGEN:   Home Oxygen: No.  Oxygen Delivery: room air  DISCHARGE LOCATION:   home   If you experience worsening of your admission symptoms, develop shortness of breath, life threatening emergency, suicidal or homicidal thoughts you must seek medical attention immediately by calling 911 or calling your MD immediately  if symptoms less severe.  You Must read complete instructions/literature along with all the possible adverse reactions/side effects for all the Medicines you take and that have been prescribed to you. Take any new Medicines after you have completely understood and accpet all the possible adverse reactions/side effects.   Please note  You were cared for by a hospitalist during your hospital stay. If you have any questions about your discharge medications or the care you received while you were in the hospital after you are discharged, you can call the unit and asked to speak with the hospitalist on call if the hospitalist that took care of you is not available. Once you are discharged, your primary care physician will handle any further medical issues. Please note that NO REFILLS for any discharge medications will be authorized once you are  discharged, as it is imperative that you return to your primary care physician (or establish a relationship with a primary care physician if you do not have one) for your aftercare needs so that they can reassess your need for medications and monitor your lab values.    On the day of Discharge:  VITAL SIGNS:   Blood pressure (!) 146/76, pulse 73, temperature 98.4 F (36.9 C), temperature source Oral, resp. rate 18, height 5\' 6"  (1.676 m), weight 56.2 kg, SpO2 96 %.  PHYSICAL EXAMINATION:    GENERAL:  84 y.o.-year-old elderly patient lying in the bed with no acute distress. Very hard of hearing EYES: Pupils equal, round, reactive to light and accommodation. No scleral icterus. Extraocular muscles intact.  HEENT: Head atraumatic, normocephalic. Oropharynx and nasopharynx clear.  NECK:  Supple, no jugular venous distention. No thyroid enlargement, no tenderness.  LUNGS: Normal breath sounds bilaterally, no wheezing, rales,rhonchi or crepitation. No use of accessory muscles of respiration. Decreased bibasilar breath sounds CARDIOVASCULAR: S1, S2 normal. No  rubs, or gallops. 2/6 systolic murmur present  ABDOMEN: Soft, non-tender, non-distended. Bowel sounds present. No organomegaly or mass.  EXTREMITIES: No pedal edema, cyanosis, or clubbing.  NEUROLOGIC: Cranial nerves II through XII are intact. Muscle strength 5/5 in all extremities. Sensation intact. Gait not checked.  PSYCHIATRIC: The patient is alert and oriented x 3.  SKIN: No obvious rash, lesion, or ulcer.   DATA REVIEW:   CBC Recent Labs  Lab 09/12/18 0426  WBC 8.6  HGB 13.5  HCT 39.6  PLT 376    Chemistries  Recent Labs  Lab 09/11/18 1159 09/12/18 0426  NA 137 136  K 3.5 3.8  CL 101 105  CO2 26 23  GLUCOSE 207* 125*  BUN 15 12  CREATININE 0.91 0.59  CALCIUM 9.3 8.7*  MG  --  2.6*  AST 25  --   ALT 14  --   ALKPHOS 59  --   BILITOT 0.6  --      Microbiology Results  No results found for this or any  previous visit.  RADIOLOGY:  No results found.   Management plans discussed with the patient, family and they are in agreement.  CODE STATUS:  Code Status History    Date Active Date Inactive Code Status Order ID Comments User Context   09/11/2018 1427 09/12/2018 2050 Full Code [id number]  Alford Highland, MD ED      TOTAL TIME TAKING CARE OF THIS PATIENT: 38 minutes.    Enid Baas M.D on 09/13/2018 at 3:30 PM  Between 7am to 6pm - Pager - [phone]  After 6pm go to [email or web address] - Social research officer, government  [facility] [place] [facility]  Office  [phone]  CC: Primary care physician; Leotis Shames, MD   Note: This dictation was prepared with Dragon dictation along with smaller phrase technology. Any transcriptional errors that result from this process are unintentional.

## 2018-09-28 ENCOUNTER — Ambulatory Visit (INDEPENDENT_AMBULATORY_CARE_PROVIDER_SITE_OTHER): Payer: Medicare Other | Admitting: Nurse Practitioner

## 2018-09-28 ENCOUNTER — Encounter: Payer: Self-pay | Admitting: Nurse Practitioner

## 2018-09-28 VITALS — BP 160/74 | HR 79 | Ht 62.0 in | Wt 125.5 lb

## 2018-09-28 DIAGNOSIS — I10 Essential (primary) hypertension: Secondary | ICD-10-CM

## 2018-09-28 DIAGNOSIS — I4819 Other persistent atrial fibrillation: Secondary | ICD-10-CM

## 2018-09-28 DIAGNOSIS — I639 Cerebral infarction, unspecified: Secondary | ICD-10-CM

## 2018-09-28 DIAGNOSIS — E782 Mixed hyperlipidemia: Secondary | ICD-10-CM | POA: Diagnosis not present

## 2018-09-28 MED ORDER — BISOPROLOL-HYDROCHLOROTHIAZIDE 10-6.25 MG PO TABS: ORAL_TABLET | ORAL | 6 refills | Status: DC

## 2018-09-28 MED ORDER — APIXABAN 2.5 MG PO TABS: 2.5000 mg | ORAL_TABLET | Freq: Two times a day (BID) | ORAL | 3 refills | Status: DC

## 2018-09-28 NOTE — Progress Notes (Signed)
Office Visit    Patient Name: Jill Mitchell Date of Encounter: 09/28/2018  Primary Care Provider:  Leotis Shames, MD Primary Cardiologist:  Julien Nordmann, MD  Chief Complaint    84 year old female with a history of hypertension, hyperlipidemia, hypothyroidism, and stage III chronic kidney disease, who presents for follow-up after recent stroke and new diagnosis of diabetes and A. fib.  Past Medical History    Past Medical History:  Diagnosis Date  . CKD (chronic kidney disease), stage III (HCC)   . CVA (cerebral vascular accident) (HCC)    a. 08/2018 MRI: acute to early subacute L parieto-occipital cortical infarct w/ faint petechial hemorrhage w/o hemorrhagic conversion; b. 08/2018 CTA Head/Neck: Completed 9cc L parietal infarct which correlates w/ L M4 branch occlusion. No stenosis or embolic source. RICA bulb 50%. 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 08/2018 Carotid U/S: No significant stenoses.  . Hyperlipidemia   . Hypertension   . Hypothyroid   . Persistent atrial fibrillation    a. 08/2018 Dx in setting of CVA ( CHA2DS2VASc = 7-->Eliquis 2.5 BID); b. 08/2018 Echo: EF 55-60%. Mild AS/AI, mild MR. Mildly dil LA. Mild to Mod TR.  . Type II diabetes mellitus (HCC)    Past Surgical History:  Procedure Laterality Date  . BREAST EXCISIONAL BIOPSY Bilateral 1970   benign  . TONSILLECTOMY AND ADENOIDECTOMY      Allergies  Allergies  Allergen Reactions  . Aspirin Other (See Comments)    Causes bruising  . Atorvastatin Nausea Only  . Cholestyramine Nausea Only  . Codeine Other (See Comments)  . Lovastatin Nausea Only    GI upset  . Niacin Other (See Comments)    ineffective  . Penicillins Other (See Comments)  . Pravastatin Nausea Only  . Ramipril Other (See Comments)  . Simvastatin Nausea Only    History of Present Illness    84 year old female with the above past medical history including hypertension, hyperlipidemia, hypothyroidism, and stage  III chronic kidney disease.  She was recently admitted to [facility] regional in late September with disorientation, aphasia, and slurring of her speech.  She was also found to be in atrial fibrillation which was relatively rate controlled.  MRI showed acute to early subacute left parietal-occipital cortical infarct with faint petechial hemorrhage without hemorrhagic conversion.  CT of the head and neck vessels showed completed 9 cc left parietal infarct correlating with a left M4 branch occlusion.  Echocardiogram showed normal LV function.  She was easily rate controlled with beta-blocker therapy and in the setting of a CHA2DS2VASc of 7 recommendation was made for initiating low-dose Eliquis (age greater than 80, weight less than 60 kg) beginning 1 week after stroke.  Since her discharge, Jill Mitchell has done well.  She has minimal residual deficit related to her stroke which presents as difficulty finding words at times.  She has been walking without any symptoms or limitations.  She remains in atrial fibrillation but only notes elevated heart rates when she has some amount of emotional upset, which is rare.  She otherwise does not realize she is in atrial fibrillation.  She denies chest pain, dyspnea, PND, orthopnea, dizziness, syncope, edema, or early satiety.  Her daughter is present with her today and all questions have been answered.  Patient and daughter are in favor of continued rate control and oral anticoagulation.  Home Medications    Prior to Admission medications   Medication Sig Start Date End Date Taking? Authorizing Provider  amLODipine (NORVASC) 5 MG tablet Take 5 mg by mouth daily.   Yes [provider]  apixaban (ELIQUIS) 2.5 MG TABS tablet Take 1 tablet (2.5 mg total) by mouth 2 (two) times daily. 09/17/18  Yes Enid Baas, MD  bisoprolol-hydrochlorothiazide (ZIAC) 5-6.25 MG tablet Take 1 tablet by mouth daily. 09/12/18  Yes Enid Baas, MD  calcium citrate-vitamin D  (CITRACAL+D) 315-200 MG-UNIT tablet Take 2 tablets by mouth daily.    Yes [provider]  cyanocobalamin (,VITAMIN B-12,) 1000 MCG/ML injection Inject 1,000 mcg into the muscle every 30 (thirty) days.   Yes [provider]  fluticasone (FLONASE) 50 MCG/ACT nasal spray Place 2 sprays into both nostrils daily.   Yes [provider]  levothyroxine (SYNTHROID, LEVOTHROID) 75 MCG tablet Take 75 mcg by mouth daily.  01/11/17  Yes [provider]  loratadine (CLARITIN) 10 MG tablet Take 10 mg by mouth daily.   Yes [provider]  LORazepam (ATIVAN) 1 MG tablet Take 0.5 mg by mouth at bedtime as needed for anxiety.  05/22/17  Yes [provider]  rosuvastatin (CRESTOR) 20 MG tablet Take 1 tablet (20 mg total) by mouth daily. 09/13/18  Yes Enid Baas, MD    Review of Systems    Occasionally difficulty with word finding but overall doing well.  Occasional elevated heart rates in the setting of emotional upset.  She denies chest pain, dyspnea, PND, orthopnea, dizziness, syncope, edema, or early satiety.  All other systems reviewed and are otherwise negative except as noted above.  Physical Exam    VS:  BP (!) 160/74 (BP Location: Right Arm, Patient Position: Sitting, Cuff Size: Normal)   Pulse 79   Ht 5\' 2"  (1.575 m)   Wt 125 lb 8 oz (56.9 kg)   BMI 22.95 kg/m  , BMI Body mass index is 22.95 kg/m. GEN: Well nourished, well developed, in no acute distress. HEENT: normal except HOH. Neck: Supple, no JVD, carotid bruits, or masses. Cardiac: IR, IR, no murmurs, rubs, or gallops. No clubbing, cyanosis, edema.  Radials/DP/PT 2+ and equal bilaterally.  Respiratory:  Respirations regular and unlabored, clear to auscultation bilaterally. GI: Soft, nontender, nondistended, BS + x 4. MS: no deformity or atrophy. Skin: warm and dry, no rash. Neuro:  Strength and sensation are intact. Psych: Normal affect.  Accessory Clinical Findings    ECG  personally reviewed by me today - Afib, 79, antlat twi - no acute changes.  Assessment & Plan    1.  Persistent atrial fibrillation: Patient recently presented with stroke in late September to [facility] and was found to be in rate controlled atrial fibrillation.  She has been recovering well from her stroke and is mostly asymptomatic from an A. fib standpoint.  She sometimes notes elevated rates with emotional upset but this does not occur frequently.  Heart rate today is 79.  Pressure 160/74.  I am going to titrate her bisoprolol to 10-6.25 mg daily.  She remains on Eliquis 2.5 mg twice daily and I will refill this today.  She will need follow-up with CBC and basic metabolic panel in about a month.  2.  Essential hypertension: Pressure was markedly elevated over the weekend with a systolic of 200.  She was seen in urgent care and amlodipine, which she had been on previously but was discontinued during hospitalization, was resumed at 5 mill grams daily.  Pressure is 160/74.  Titrating bisoprolol to 10 mg.  She will continue to follow her blood  pressure once daily at home.  3.  Stroke: Recovering well.  Some word finding issues but overall improving.  No physical deficits.  4.  Hyperlipidemia: LDL was 110 September 25.  Her Crestor dose was increased from 10 mill grams to 20 g at that time.  Plan to follow-up lipids and LFTs in 1 month.  5.  Stage III chronic kidney disease: Creatinine was 0.59 on September 25.  Following up basic metabolic panel in a month.  6.  DMII:  Diet controlled.  A1c 6.5.  Managed by primary care.  7.  Disposition: Follow-up in 1 month, at which time we will plan to also check a CBC, complete metabolic panel, and lipids.   Nicolasa Ducking, NP 09/28/2018, 10:45 AM

## 2018-09-28 NOTE — Patient Instructions (Addendum)
Medication Instructions:  - Your physician has recommended you make the following change in your medication:   1) INCREASE bisoprolol/hct to 10/6.25 mg- take 1 tablet by mouth once daily  If you need a refill on your cardiac medications before your next appointment, please call your pharmacy.   Lab work: - none ordered  If you have labs (blood work) drawn today and your tests are completely normal, you will receive your results only by: Marland Kitchen MyChart Message (if you have MyChart) OR . A paper copy in the mail If you have any lab test that is abnormal or we need to change your treatment, we will call you to review the results.  Testing/Procedures: - none ordered  Follow-Up: At [facility], you and your health needs are our priority.  As part of our continuing mission to provide you with exceptional heart care, we have created designated Provider Care Teams.  These Care Teams include your primary Cardiologist (physician) and Advanced Practice Providers (APPs -  Physician Assistants and Nurse Practitioners) who all work together to provide you with the care you need, when you need it. . 1 month with Dr. Mariah Milling  Any Other Special Instructions Will Be Listed Below (If Applicable). - N/A

## 2018-10-04 ENCOUNTER — Other Ambulatory Visit: Payer: Self-pay | Admitting: Nurse Practitioner

## 2018-10-04 MED ORDER — AMLODIPINE BESYLATE 10 MG PO TABS: 10.0000 mg | ORAL_TABLET | Freq: Every day | ORAL | 6 refills | Status: DC

## 2018-10-16 ENCOUNTER — Ambulatory Visit: Payer: Medicare Other | Admitting: Cardiovascular Disease

## 2018-10-22 ENCOUNTER — Ambulatory Visit: Payer: Medicare Other | Admitting: Cardiovascular Disease

## 2018-11-11 NOTE — Progress Notes (Signed)
Cardiology Office Note  Date:  11/13/2018   ID:  Redge Gainerhyllis B Ramcharan, DOB 1923-11-27, MRN [id number]  PCP:  Leotis ShamesSingh, Jasmine, MD   Chief Complaint  Patient presents with  . other    1 mo  follow up. Medications reviewed verbally.     HPI:  84 year old female with a history of  hypertension,  hyperlipidemia,  hypothyroidism, and  stage III chronic kidney disease,  stroke  Hard of hearing new diagnosis of diabetes and A. Fib.  admitted to [facility] September 2019 with disorientation, aphasia, and slurring of her speech.    found to be in atrial fibrillation  MRI showed acute to early subacute left parietal-occipital cortical infarct with faint petechial hemorrhage without hemorrhagic conversion.    CT of the head and neck vessels showed completed 9 cc left parietal infarct correlating with a left M4 branch occlusion.    Echocardiogram showed normal LV function.     rate controlled with beta-blocker therapy   CHA2DS2VASc of 7 low-dose Eliquis (age greater than 80, weight less than 60 kg) beginning 1 week after stroke.  On today's visit reports that she has general malaise, nausea, wonders if it is from the medications Reports that she was doing better on low-dose amlodipine and low-dose Crestor HTN on last clinic visit Nausea, lost taste on amlodipine 10 mg Low BP on norvasc 10 per the patient at home Systolic <110 per the patient Review of her numbers shows variable blood pressures typically in the 110  to 120s  EKG personally reviewed by myself on todays visit Shows atrial fibrillation with ventricular rate 79 bpm PVCs nonspecific T wave abnormality   PMH:   has a past medical history of CKD (chronic kidney disease), stage III (HCC), CVA (cerebral vascular accident) (HCC), Hyperlipidemia, Hypertension, Hypothyroid, Persistent atrial fibrillation, and Type II diabetes mellitus (HCC).  PSH:    Past Surgical History:  Procedure Laterality Date  . BREAST EXCISIONAL  BIOPSY Bilateral 1970   benign  . TONSILLECTOMY AND ADENOIDECTOMY      Current Outpatient Medications  Medication Sig Dispense Refill  . amLODipine (NORVASC) 10 MG tablet Take 1 tablet (10 mg total) by mouth daily. 30 tablet 6  . apixaban (ELIQUIS) 2.5 MG TABS tablet Take 1 tablet (2.5 mg total) by mouth 2 (two) times daily. 180 tablet 3  . bisoprolol-hydrochlorothiazide (ZIAC) 10-6.25 MG tablet Take 1 tablet (10/6.25 mg) by mouth once daily 30 tablet 6  . calcium citrate-vitamin D (CITRACAL+D) 315-200 MG-UNIT tablet Take 2 tablets by mouth daily.     . cyanocobalamin (,VITAMIN B-12,) 1000 MCG/ML injection Inject 1,000 mcg into the muscle every 30 (thirty) days.    . fluticasone (FLONASE) 50 MCG/ACT nasal spray Place 2 sprays into both nostrils daily.    Marland Kitchen. levothyroxine (SYNTHROID, LEVOTHROID) 75 MCG tablet Take 75 mcg by mouth daily.     Marland Kitchen. loratadine (CLARITIN) 10 MG tablet Take 10 mg by mouth daily.    Marland Kitchen. LORazepam (ATIVAN) 1 MG tablet Take 0.5 mg by mouth at bedtime as needed for anxiety.     . rosuvastatin (CRESTOR) 20 MG tablet Take 1 tablet (20 mg total) by mouth daily. 30 tablet 2   No current facility-administered medications for this visit.      Allergies:   Aspirin; Atorvastatin; Cholestyramine; Codeine; Lovastatin; Niacin; Penicillins; Pravastatin; Ramipril; and Simvastatin   Social History:  The patient  reports that she has never smoked. She has never used smokeless tobacco. She reports that she drank alcohol.  She reports that she does not use drugs.   Family History:   family history includes Breast cancer (age of onset: 43) in her paternal aunt; Breast cancer (age of onset: 4) in her sister; CAD (age of onset: 62) in her mother; CAD (age of onset: 75) in her father.    Review of Systems: Review of Systems  Constitutional: Negative.        Malaise  Respiratory: Negative.   Cardiovascular: Negative.   Gastrointestinal: Positive for nausea.  Musculoskeletal: Negative.    Neurological: Negative.   Psychiatric/Behavioral: Negative.   All other systems reviewed and are negative.   PHYSICAL EXAM: VS:  BP (!) 158/80 (BP Location: Left Arm, Patient Position: Sitting, Cuff Size: Normal)   Pulse 79   Ht 5\' 2"  (1.575 m)   Wt 126 lb (57.2 kg)   BMI 23.05 kg/m  , BMI Body mass index is 23.05 kg/m. Constitutional:  oriented to person, place, and time. No distress.  HENT:  Head: Grossly normal Eyes:  no discharge. No scleral icterus.  Neck: No JVD, no carotid bruits  Cardiovascular: Irregularly irregular,  no murmurs appreciated Pulmonary/Chest: Clear to auscultation bilaterally, no wheezes or rails Abdominal: Soft.  no distension.  no tenderness.  Musculoskeletal: Normal range of motion Neurological:  normal muscle tone. Coordination normal. No atrophy Skin: Skin warm and dry Psychiatric: normal affect, pleasant   Recent Labs: 09/11/2018: ALT 14 09/12/2018: BUN 12; Creatinine, Ser 0.59; Hemoglobin 13.5; Magnesium 2.6; Platelets 376; Potassium 3.8; Sodium 136    Lipid Panel Lab Results  Component Value Date   CHOL 182 09/12/2018   HDL 51 09/12/2018   LDLCALC 110 (H) 09/12/2018   TRIG 106 09/12/2018      Wt Readings from Last 3 Encounters:  11/13/18 126 lb (57.2 kg)  09/28/18 125 lb 8 oz (56.9 kg)  09/11/18 124 lb (56.2 kg)      ASSESSMENT AND PLAN:  Cerebrovascular accident (CVA), unspecified mechanism (HCC) On anticoagulation, permanent atrial fibrillation Good recovery  Persistent atrial fibrillation - Plan: CANCELED: EKG 12-Lead Rate well controlled on current medications Tolerating anticoagulation, Eliquis  Essential hypertension She wants to decrease amlodipine down to 5 mg daily, felt better on the lower dose Prescription sent in for amlodipine 5 daily She will continue to monitor blood pressure  Mixed hyperlipidemia Reports having burning in her stomach, wonders if it is from the Crestor Thinks it is from the higher dose,  was doing better on 10 mg Suggested she see how she feels on the low-dose amlodipine if symptoms persist she could hold the Crestor for 1 to 2 weeks to see if symptoms resolve Suggested it is less likely causing her discomfort  Disposition:   F/U  6 months   Total encounter time more than 25 minutes  Greater than 50% was spent in counseling and coordination of care with the patient   No orders of the defined types were placed in this encounter.    Signed, Dossie Arbour, M.D., Ph.D. 11/13/2018  [facility] [place], [place] [phone]

## 2018-11-13 ENCOUNTER — Encounter: Payer: Self-pay | Admitting: Cardiovascular Disease

## 2018-11-13 ENCOUNTER — Ambulatory Visit (INDEPENDENT_AMBULATORY_CARE_PROVIDER_SITE_OTHER): Payer: Medicare Other | Admitting: Cardiovascular Disease

## 2018-11-13 VITALS — BP 158/80 | HR 79 | Ht 62.0 in | Wt 126.0 lb

## 2018-11-13 DIAGNOSIS — I639 Cerebral infarction, unspecified: Secondary | ICD-10-CM

## 2018-11-13 DIAGNOSIS — I10 Essential (primary) hypertension: Secondary | ICD-10-CM

## 2018-11-13 DIAGNOSIS — E782 Mixed hyperlipidemia: Secondary | ICD-10-CM

## 2018-11-13 DIAGNOSIS — I4819 Other persistent atrial fibrillation: Secondary | ICD-10-CM | POA: Diagnosis not present

## 2018-11-13 MED ORDER — AMLODIPINE BESYLATE 5 MG PO TABS: 5.0000 mg | ORAL_TABLET | Freq: Every day | ORAL | 3 refills | Status: DC

## 2018-11-13 NOTE — Patient Instructions (Addendum)
Medication Instructions:   Please decrease the amlodipine down to 5 mg daily  If stomach burning persists, Hold the crestor for 1 to 2 weeks and see if symptoms get better  If you need a refill on your cardiac medications before your next appointment, please call your pharmacy.    Lab work: No new labs needed   If you have labs (blood work) drawn today and your tests are completely normal, you will receive your results only by: Marland Kitchen. MyChart Message (if you have MyChart) OR . A paper copy in the mail If you have any lab test that is abnormal or we need to change your treatment, we will call you to review the results.   Testing/Procedures: No new testing needed   Follow-Up: At [facility], you and your health needs are our priority.  As part of our continuing mission to provide you with exceptional heart care, we have created designated Provider Care Teams.  These Care Teams include your primary Cardiologist (physician) and Advanced Practice Providers (APPs -  Physician Assistants and Nurse Practitioners) who all work together to provide you with the care you need, when you need it.  . You will need a follow up appointment in 6 months .   Please call our office 2 months in advance to schedule this appointment.    . Providers on your designated Care Team:   . Nicolasa Duckinghristopher Berge, NP . Eula Listenyan Dunn, PA-C . Marisue IvanJacquelyn Visser, PA-C  Any Other Special Instructions Will Be Listed Below (If Applicable).  For educational health videos Log in to : [email or web address].com Or : [email or web address], password : [email or web address]

## 2018-11-21 ENCOUNTER — Other Ambulatory Visit: Payer: Self-pay | Admitting: Internal Medicine

## 2018-11-21 DIAGNOSIS — R1011 Right upper quadrant pain: Secondary | ICD-10-CM

## 2018-11-21 DIAGNOSIS — R110 Nausea: Secondary | ICD-10-CM

## 2018-11-23 ENCOUNTER — Ambulatory Visit
Admission: RE | Admit: 2018-11-23 | Discharge: 2018-11-23 | Disposition: A | Discharge status: Home / Self Care | Payer: Medicare Other | Source: Ambulatory Visit | Attending: Internal Medicine | Admitting: Internal Medicine

## 2018-11-23 DIAGNOSIS — R110 Nausea: Secondary | ICD-10-CM | POA: Insufficient documentation

## 2018-11-23 DIAGNOSIS — R1011 Right upper quadrant pain: Secondary | ICD-10-CM | POA: Diagnosis not present

## 2018-12-18 ENCOUNTER — Other Ambulatory Visit: Payer: Self-pay | Admitting: *Deleted

## 2018-12-18 MED ORDER — ROSUVASTATIN CALCIUM 20 MG PO TABS: 20.0000 mg | ORAL_TABLET | Freq: Every day | ORAL | 3 refills | Status: DC

## 2019-01-15 ENCOUNTER — Telehealth: Payer: Self-pay | Admitting: Cardiovascular Disease

## 2019-01-15 NOTE — Telephone Encounter (Signed)
Patient daughter calling States that patient is having stomach issues and thinks it could be attributed to medications Patient is nauseous, weak, has no appetite and stomach is burning Scheduled an appointment to see R Dunn on 2/18 Would like to discuss with nurse , please call [phone]

## 2019-01-16 NOTE — Telephone Encounter (Signed)
Spoke with patient's daughter. She described patient's symptoms: Deceased appetite, hardly eating, "doesn't have a taste for anything." Stomach burning. Dr Mariah Milling suggested Prilosec which has not been helping. Symptoms been going on since November. Right arm hurts as well. Advised her to call Dr Thedore Mins, patient's PCP, for further evaluation.  She verbalized understanding.

## 2019-02-05 ENCOUNTER — Ambulatory Visit: Payer: Medicare Other | Admitting: Physician Assistant

## 2019-02-11 ENCOUNTER — Telehealth: Payer: Self-pay

## 2019-02-11 MED ORDER — BISOPROLOL-HYDROCHLOROTHIAZIDE 10-6.25 MG PO TABS: ORAL_TABLET | ORAL | 6 refills | Status: DC

## 2019-02-11 NOTE — Telephone Encounter (Signed)
RX for Bisoprolol-HCTZ sent to [facility].

## 2019-06-12 DIAGNOSIS — Z8673 Personal history of transient ischemic attack (TIA), and cerebral infarction without residual deficits: Secondary | ICD-10-CM | POA: Insufficient documentation

## 2019-06-12 DIAGNOSIS — Z0000 Encounter for general adult medical examination without abnormal findings: Secondary | ICD-10-CM | POA: Insufficient documentation

## 2019-07-11 ENCOUNTER — Ambulatory Visit: Payer: Medicare Other | Admitting: Cardiovascular Disease

## 2019-07-15 ENCOUNTER — Telehealth: Payer: Self-pay | Admitting: Cardiovascular Disease

## 2019-07-15 NOTE — Telephone Encounter (Signed)

## 2019-07-15 NOTE — Progress Notes (Signed)
Cardiology Office Note  Date:  07/16/2019   ID:  Jill Mitchell, DOB 1923-12-19, MRN [id number]  PCP:  Leotis ShamesSingh, Jasmine, MD   Chief Complaint  Patient presents with  . Other    6 month follow up. Meds reviewed verbally with patient.     HPI:  85 year old female with a history of  hypertension,  hyperlipidemia,  hypothyroidism, and  stage III chronic kidney disease,  stroke  Hard of hearing Who presents for follow-up of her diabetes and A. Fib.  In follow-up today reports that she is getting some low blood pressure numbers and is concerned Brings readings in from home, periodically down to 107 systolic, otherwise in the 1 teens to 120 range  Pulses 50s Reports she is otherwise asymptomatic but concerned low pressure makes her somewhat tired She would like to cut back on her amlodipine Reports that she is compliant with her Crestor 20 mg daily  Denies any TIA symptoms, is not having symptoms from her atrial fibrillation Denies palpitations or tachycardia or ectopy  EKG personally reviewed by myself on todays visit Shows atrial fibrillation ventricular rate 67 bpm PVCs noted  Other past medical history reviewed   [facility] September 2019 with disorientation, aphasia, and slurring of her speech.    found to be in atrial fibrillation  MRI showed acute to early subacute left parietal-occipital cortical infarct with faint petechial hemorrhage without hemorrhagic conversion.    CT of the head and neck vessels showed completed 9 cc left parietal infarct correlating with a left M4 branch occlusion.    Echocardiogram showed normal LV function.     rate controlled with beta-blocker therapy   CHA2DS2VASc of 7 low-dose Eliquis (age greater than 80, weight less than 60 kg) beginning 1 week after stroke.   PMH:   has a past medical history of CKD (chronic kidney disease), stage III (HCC), CVA (cerebral vascular accident) (HCC), Hyperlipidemia, Hypertension, Hypothyroid,  Persistent atrial fibrillation, and Type II diabetes mellitus (HCC).  PSH:    Past Surgical History:  Procedure Laterality Date  . BREAST EXCISIONAL BIOPSY Bilateral 1970   benign  . TONSILLECTOMY AND ADENOIDECTOMY      Current Outpatient Medications  Medication Sig Dispense Refill  . amLODipine (NORVASC) 10 MG tablet Take 0.5 tablet twice a day    . apixaban (ELIQUIS) 2.5 MG TABS tablet Take 1 tablet (2.5 mg total) by mouth 2 (two) times daily. 180 tablet 3  . bisoprolol-hydrochlorothiazide (ZIAC) 10-6.25 MG tablet Take 1 tablet (10/6.25 mg) by mouth once daily 30 tablet 6  . calcium citrate-vitamin D (CITRACAL+D) 315-200 MG-UNIT tablet Take 2 tablets by mouth daily.     . cetirizine (ZYRTEC) 10 MG tablet Take by mouth.    . Cyanocobalamin (VITAMIN B 12 PO) Take 1,000 Units by mouth daily.    Marland Kitchen. ipratropium (ATROVENT) 0.03 % nasal spray USE 2 SPRAYS IN EACH NOSTRIL 2 TO 3 TIMES A DAY AS NEEDED FOR NASAL DRAINAGE    . levothyroxine (SYNTHROID, LEVOTHROID) 75 MCG tablet Take 75 mcg by mouth daily.     Marland Kitchen. omeprazole (PRILOSEC) 40 MG capsule Take 40 mg by mouth daily.    . rosuvastatin (CRESTOR) 20 MG tablet Take 1 tablet (20 mg total) by mouth daily. 90 tablet 3  . traZODone (DESYREL) 50 MG tablet 50 mg daily.    Marland Kitchen. triamcinolone (NASACORT) 55 MCG/ACT AERO nasal inhaler 2 sprays daily.     No current facility-administered medications for this visit.  Allergies:   Aspirin, Atorvastatin, Cholestyramine, Codeine, Lovastatin, Niacin, Penicillins, Pravastatin, Ramipril, and Simvastatin   Social History:  The patient  reports that she has never smoked. She has never used smokeless tobacco. She reports previous alcohol use. She reports that she does not use drugs.   Family History:   family history includes Breast cancer (age of onset: 43) in her paternal aunt; Breast cancer (age of onset: 21) in her sister; CAD (age of onset: 65) in her mother; CAD (age of onset: 67) in her father.     Review of Systems: Review of Systems  Constitutional: Negative.        Malaise  HENT: Negative.   Respiratory: Negative.   Cardiovascular: Negative.   Gastrointestinal: Negative.   Musculoskeletal: Negative.   Neurological: Negative.   Psychiatric/Behavioral: Negative.   All other systems reviewed and are negative.   PHYSICAL EXAM: VS:  BP 140/72 (BP Location: Left Arm, Patient Position: Sitting, Cuff Size: Normal)   Pulse 67   Ht 5\' 2"  (1.575 m)   Wt 127 lb (57.6 kg)   BMI 23.23 kg/m  , BMI Body mass index is 23.23 kg/m. Constitutional:  oriented to person, place, and time. No distress.  HENT:  Head: Grossly normal Eyes:  no discharge. No scleral icterus.  Neck: No JVD, no carotid bruits  Cardiovascular: Irregularly irregular,  no murmurs appreciated Pulmonary/Chest: Clear to auscultation bilaterally, no wheezes or rails Abdominal: Soft.  no distension.  no tenderness.  Musculoskeletal: Normal range of motion Neurological:  normal muscle tone. Coordination normal. No atrophy Skin: Skin warm and dry Psychiatric: normal affect, pleasant   Recent Labs: 09/11/2018: ALT 14 09/12/2018: BUN 12; Creatinine, Ser 0.59; Hemoglobin 13.5; Magnesium 2.6; Platelets 376; Potassium 3.8; Sodium 136    Lipid Panel Lab Results  Component Value Date   CHOL 182 09/12/2018   HDL 51 09/12/2018   LDLCALC 110 (H) 09/12/2018   TRIG 106 09/12/2018      Wt Readings from Last 3 Encounters:  07/16/19 127 lb (57.6 kg)  11/13/18 126 lb (57.2 kg)  09/28/18 125 lb 8 oz (56.9 kg)      ASSESSMENT AND PLAN:  Cerebrovascular accident (CVA), unspecified mechanism (Agency) On anticoagulation, permanent atrial fibrillation Recovered well from stroke No further TIA symptoms  Persistent atrial fibrillation - Plan: CANCELED: EKG 12-Lead Rate well controlled on current medications Tolerating anticoagulation, Eliquis Will continue bisoprolol  Essential hypertension She wants to decrease  amlodipine down to 5 mg daily,  Suggested she decrease the dose down to 5 a day, add extra 5 in the evening for any systolic pressure greater than 140  Mixed hyperlipidemia Tolerating crestor 20 Lab work with primary care  Disposition:   F/U  12 months   Total encounter time more than 25 minutes  Greater than 50% was spent in counseling and coordination of care with the patient    Orders Placed This Encounter  Procedures  . EKG 12-Lead     Signed, Esmond Plants, M.D., Ph.D. 07/16/2019  [place], [place]

## 2019-07-16 ENCOUNTER — Encounter: Payer: Self-pay | Admitting: Cardiovascular Disease

## 2019-07-16 ENCOUNTER — Other Ambulatory Visit: Payer: Self-pay

## 2019-07-16 ENCOUNTER — Ambulatory Visit (INDEPENDENT_AMBULATORY_CARE_PROVIDER_SITE_OTHER): Payer: Medicare Other | Admitting: Cardiovascular Disease

## 2019-07-16 VITALS — BP 140/72 | HR 67 | Ht 62.0 in | Wt 127.0 lb

## 2019-07-16 DIAGNOSIS — I10 Essential (primary) hypertension: Secondary | ICD-10-CM

## 2019-07-16 DIAGNOSIS — I639 Cerebral infarction, unspecified: Secondary | ICD-10-CM | POA: Diagnosis not present

## 2019-07-16 DIAGNOSIS — E782 Mixed hyperlipidemia: Secondary | ICD-10-CM | POA: Diagnosis not present

## 2019-07-16 DIAGNOSIS — I4819 Other persistent atrial fibrillation: Secondary | ICD-10-CM

## 2019-07-16 MED ORDER — AMLODIPINE BESYLATE 5 MG PO TABS: 5.0000 mg | ORAL_TABLET | ORAL | 3 refills | Status: DC

## 2019-07-16 MED ORDER — ROSUVASTATIN CALCIUM 20 MG PO TABS: 20.0000 mg | ORAL_TABLET | Freq: Every day | ORAL | 3 refills | Status: DC

## 2019-07-16 NOTE — Patient Instructions (Addendum)
Medication Instructions:  Your physician has recommended you make the following change in your medication:  1. Amlodipine 5 mg in the AM only take another pill in PM for blood pressure greater than 140   If you need a refill on your cardiac medications before your next appointment, please call your pharmacy.    Lab work: No new labs needed   If you have labs (blood work) drawn today and your tests are completely normal, you will receive your results only by: Marland Kitchen. MyChart Message (if you have MyChart) OR . A paper copy in the mail If you have any lab test that is abnormal or we need to change your treatment, we will call you to review the results.   Testing/Procedures: No new testing needed   Follow-Up: At [facility], you and your health needs are our priority.  As part of our continuing mission to provide you with exceptional heart care, we have created designated Provider Care Teams.  These Care Teams include your primary Cardiologist (physician) and Advanced Practice Providers (APPs -  Physician Assistants and Nurse Practitioners) who all work together to provide you with the care you need, when you need it.  . You will need a follow up appointment in 12 months .   Please call our office 2 months in advance to schedule this appointment.    . Providers on your designated Care Team:   . Nicolasa Duckinghristopher Berge, NP . Eula Listenyan Dunn, PA-C . Marisue IvanJacquelyn Visser, PA-C  Any Other Special Instructions Will Be Listed Below (If Applicable).  For educational health videos Log in to : [email or web address].com Or : [email or web address], password : [email or web address]   How to Take Your Blood Pressure You can take your blood pressure at home with a machine. You may need to check your blood pressure at home:  To check if you have high blood pressure (hypertension).  To check your blood pressure over time.  To make sure your blood pressure medicine is working. Supplies needed: You will need a blood pressure machine, or  monitor. You can buy one at a drugstore or online. When choosing one:  Choose one with an arm cuff.  Choose one that wraps around your upper arm. Only one finger should fit between your arm and the cuff.  Do not choose one that measures your blood pressure from your wrist or finger. Your doctor can suggest a monitor. How to prepare Avoid these things for 30 minutes before checking your blood pressure:  Drinking caffeine.  Drinking alcohol.  Eating.  Smoking.  Exercising. Five minutes before checking your blood pressure:  Pee.  Sit in a dining chair. Avoid sitting in a soft couch or armchair.  Be quiet. Do not talk. How to take your blood pressure Follow the instructions that came with your machine. If you have a digital blood pressure monitor, these may be the instructions: 1. Sit up straight. 2. Place your feet on the floor. Do not cross your ankles or legs. 3. Rest your left arm at the level of your heart. You may rest it on a table, desk, or chair. 4. Pull up your shirt sleeve. 5. Wrap the blood pressure cuff around the upper part of your left arm. The cuff should be 1 inch (2.5 cm) above your elbow. It is best to wrap the cuff around bare skin. 6. Fit the cuff snugly around your arm. You should be able to place only one finger between the cuff and your arm. 7. Put the cord  inside the groove of your elbow. 8. Press the power button. 9. Sit quietly while the cuff fills with air and loses air. 10. Write down the numbers on the screen. 11. Wait 2-3 minutes and then repeat steps 1-10. What do the numbers mean? Two numbers make up your blood pressure. The first number is called systolic pressure. The second is called diastolic pressure. An example of a blood pressure reading is "120 over 80" (or 120/80). If you are an adult and do not have a medical condition, use this guide to find out if your blood pressure is normal: Normal  First number: below 120.  Second number:  below 80. Elevated  First number: 120-129.  Second number: below 80. Hypertension stage 1  First number: 130-139.  Second number: 80-89. Hypertension stage 2  First number: 140 or above.  Second number: 62 or above. Your blood pressure is above normal even if only the top or bottom number is above normal. Follow these instructions at home:  Check your blood pressure as often as your doctor tells you to.  Take your monitor to your next doctor's appointment. Your doctor will: ? Make sure you are using it correctly. ? Make sure it is working right.  Make sure you understand what your blood pressure numbers should be.  Tell your doctor if your medicines are causing side effects. Contact a doctor if:  Your blood pressure keeps being high. Get help right away if:  Your first blood pressure number is higher than 180.  Your second blood pressure number is higher than 120. This information is not intended to replace advice given to you by your health care provider. Make sure you discuss any questions you have with your health care provider. Document Released: 11/17/2008 Document Revised: 11/17/2017 Document Reviewed: 05/13/2016 Elsevier Patient Education  2020 [place].  Blood Pressure Record Sheet To take your blood pressure, you will need a blood pressure machine. You can buy a blood pressure machine (blood pressure monitor) at your clinic, drug store, or online. When choosing one, consider:  An automatic monitor that has an arm cuff.  A cuff that wraps snugly around your upper arm. You should be able to fit only one finger between your arm and the cuff.  A device that stores blood pressure reading results.  Do not choose a monitor that measures your blood pressure from your wrist or finger. Follow your health care provider's instructions for how to take your blood pressure. To use this form:  Get one reading in the morning (a.m.) before you take any  medicines.  Get one reading in the evening (p.m.) before supper.  Take at least 2 readings with each blood pressure check. This makes sure the results are correct. Wait 1-2 minutes between measurements.  Write down the results in the spaces on this form.  Repeat this once a week, or as told by your health care provider.  Make a follow-up appointment with your health care provider to discuss the results. Blood pressure log Date: _______________________  a.m. _____________________(1st reading) _____________________(2nd reading)  p.m. _____________________(1st reading) _____________________(2nd reading) Date: _______________________  a.m. _____________________(1st reading) _____________________(2nd reading)  p.m. _____________________(1st reading) _____________________(2nd reading) Date: _______________________  a.m. _____________________(1st reading) _____________________(2nd reading)  p.m. _____________________(1st reading) _____________________(2nd reading) Date: _______________________  a.m. _____________________(1st reading) _____________________(2nd reading)  p.m. _____________________(1st reading) _____________________(2nd reading) Date: _______________________  a.m. _____________________(1st reading) _____________________(2nd reading)  p.m. _____________________(1st reading) _____________________(2nd reading) This information is not intended to replace advice given to you  by your health care provider. Make sure you discuss any questions you have with your health care provider. Document Released: 09/03/2003 Document Revised: 02/02/2018 Document Reviewed: 12/05/2017 Elsevier Patient Education  2020 [employer]

## 2019-07-22 ENCOUNTER — Other Ambulatory Visit: Payer: Self-pay | Admitting: Internal Medicine

## 2019-07-22 DIAGNOSIS — Z1231 Encounter for screening mammogram for malignant neoplasm of breast: Secondary | ICD-10-CM

## 2019-09-06 ENCOUNTER — Other Ambulatory Visit: Payer: Self-pay

## 2019-09-06 ENCOUNTER — Ambulatory Visit
Admission: RE | Admit: 2019-09-06 | Discharge: 2019-09-06 | Disposition: A | Discharge status: Home / Self Care | Payer: Medicare Other | Source: Ambulatory Visit | Attending: Internal Medicine | Admitting: Internal Medicine

## 2019-09-06 DIAGNOSIS — Z1231 Encounter for screening mammogram for malignant neoplasm of breast: Secondary | ICD-10-CM | POA: Insufficient documentation

## 2019-09-17 ENCOUNTER — Other Ambulatory Visit: Payer: Self-pay | Admitting: *Deleted

## 2019-09-17 ENCOUNTER — Other Ambulatory Visit: Payer: Self-pay | Admitting: Pharmacist

## 2019-09-17 MED ORDER — BISOPROLOL-HYDROCHLOROTHIAZIDE 10-6.25 MG PO TABS: ORAL_TABLET | ORAL | 2 refills | Status: DC

## 2019-09-17 MED ORDER — APIXABAN 2.5 MG PO TABS: 2.5000 mg | ORAL_TABLET | Freq: Two times a day (BID) | ORAL | 1 refills | Status: DC

## 2019-09-27 DIAGNOSIS — E1151 Type 2 diabetes mellitus with diabetic peripheral angiopathy without gangrene: Secondary | ICD-10-CM | POA: Insufficient documentation

## 2019-10-30 IMAGING — MG MM DIGITAL SCREENING BILAT W/ TOMO W/ CAD
7 series · 9 of 23 positions shown · non-contrast
Comparison: Previous exam(s).

CLINICAL DATA: Screening.

EXAM:
DIGITAL SCREENING BILATERAL MAMMOGRAM WITH TOMO AND CAD

[L CC synth-2D]
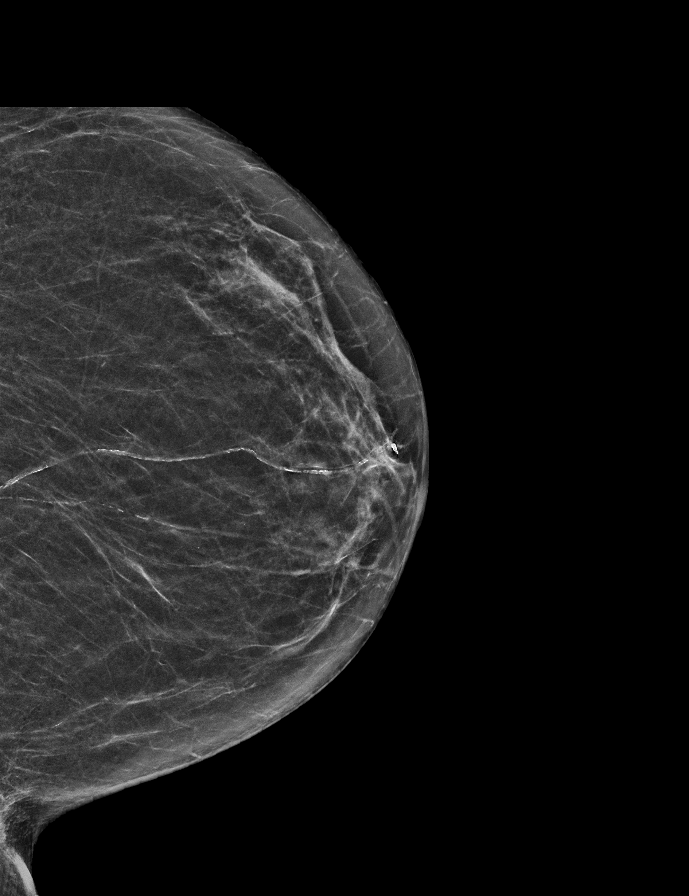

[R CC synth-2D]
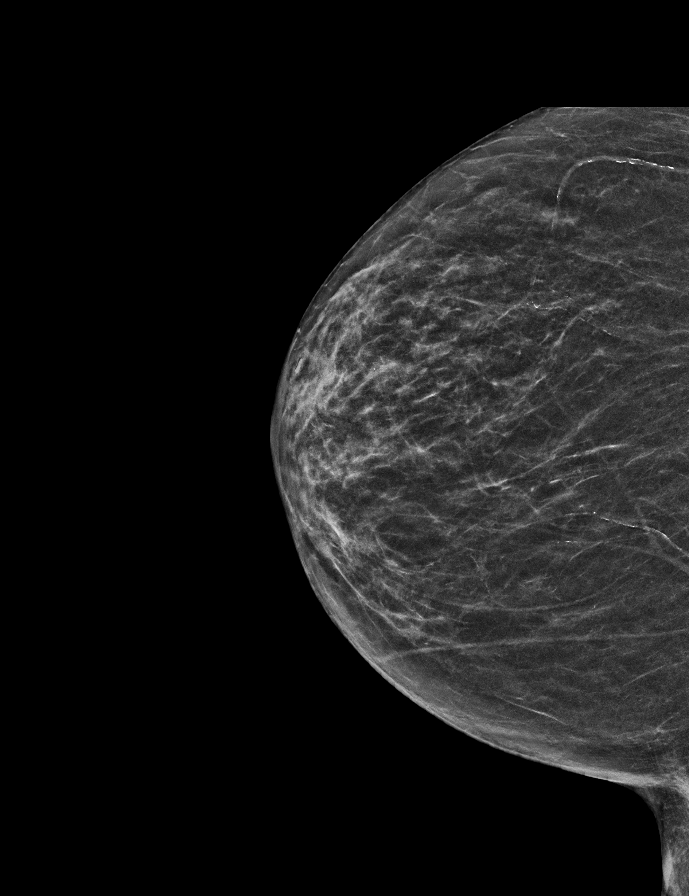

[L MLO synth-2D]
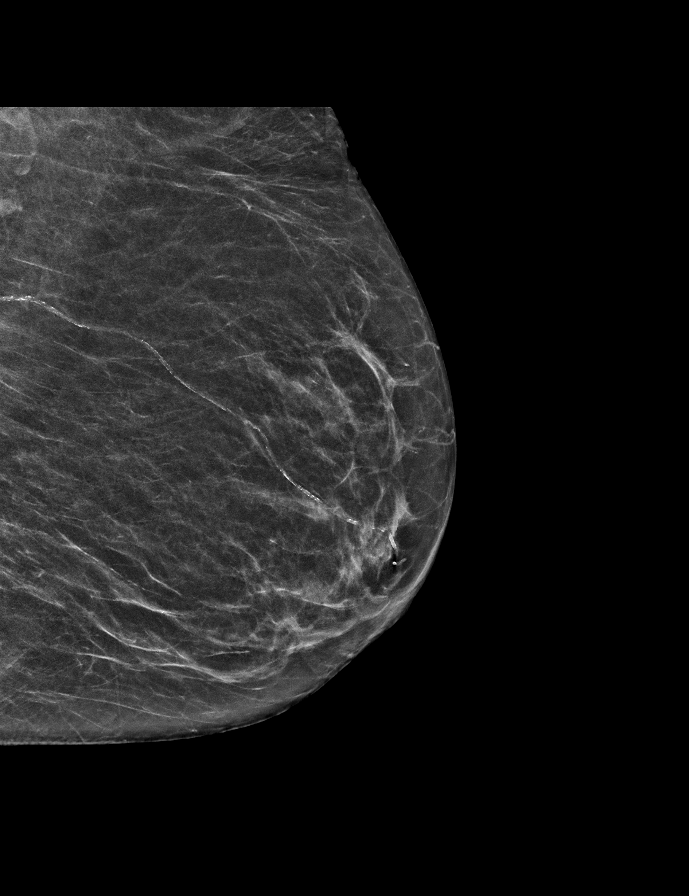

[R CC tomo · 3 of 47 frames shown]
[frame 16/47]
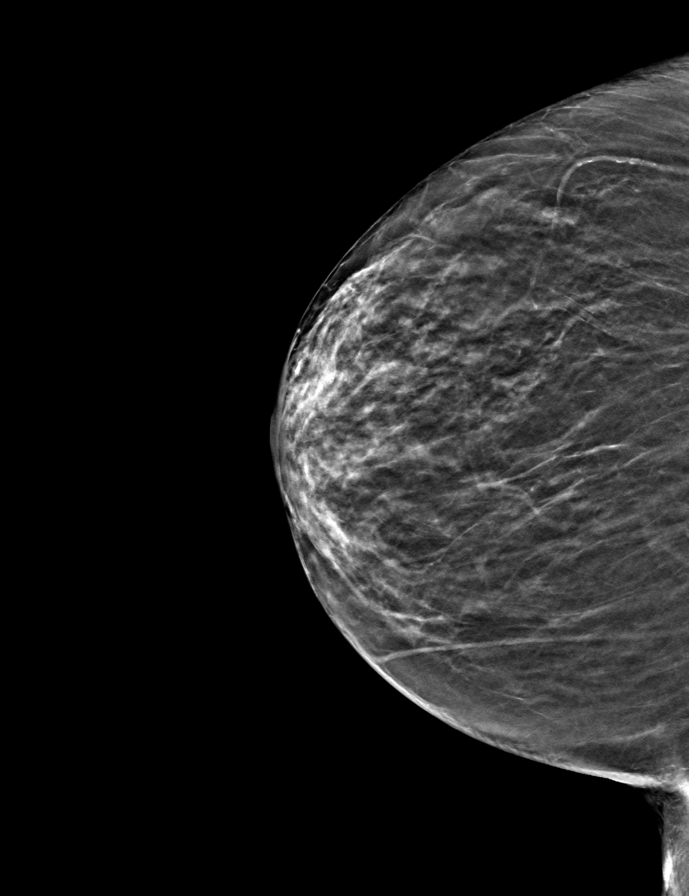
[frame 24/47]
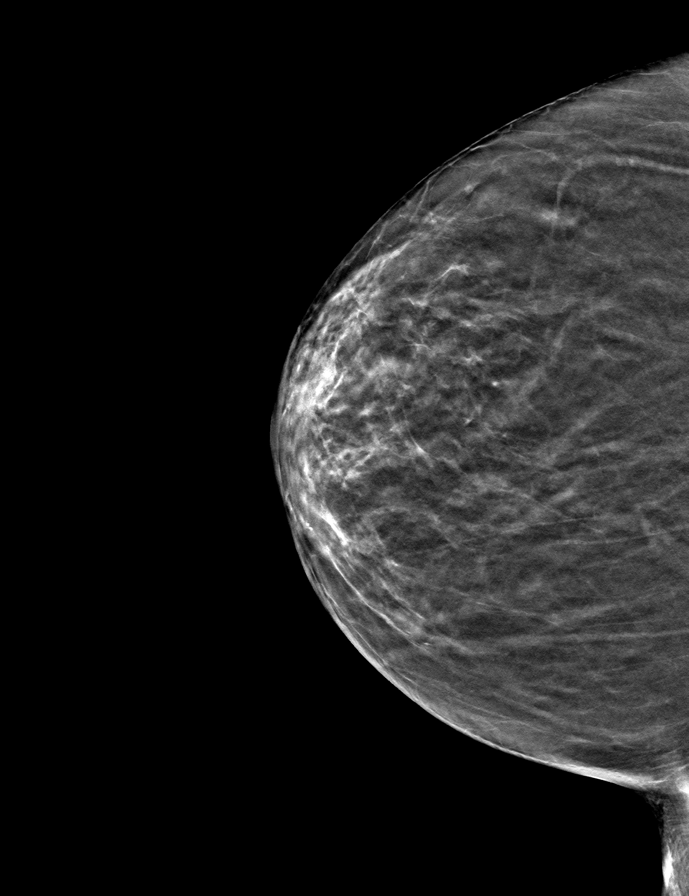
[frame 32/47]
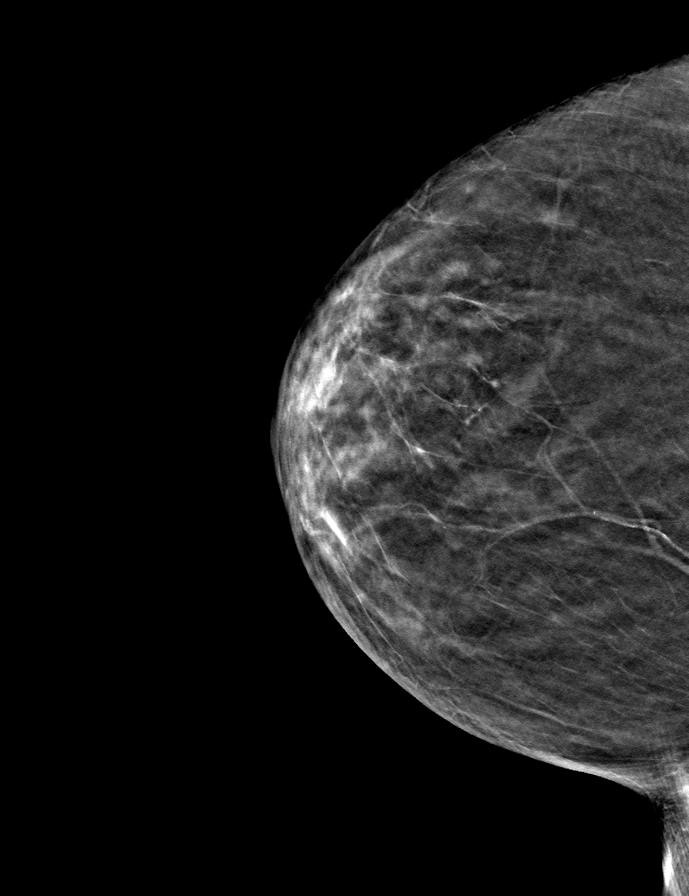

[L MLO tomo · tomo slice 27/52.0]
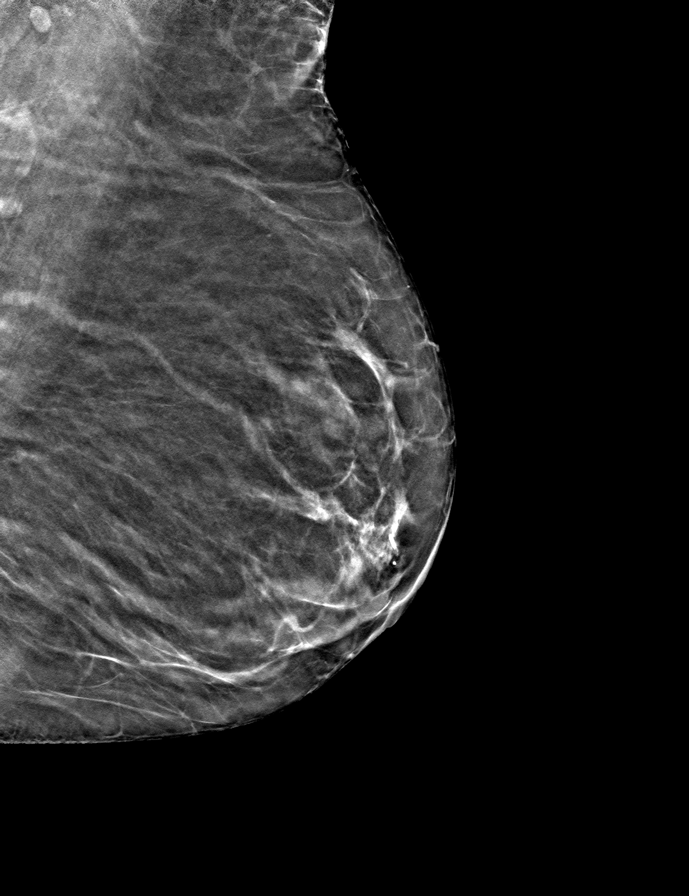

[L CC tomo · tomo slice 25/50.0]
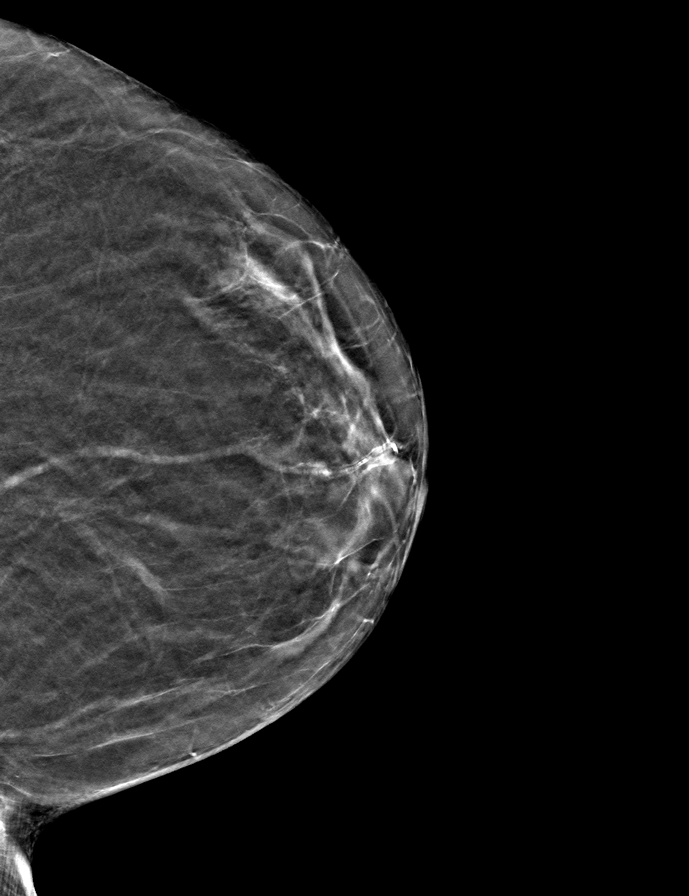

[R MLO tomo · tomo slice 23/46.0]
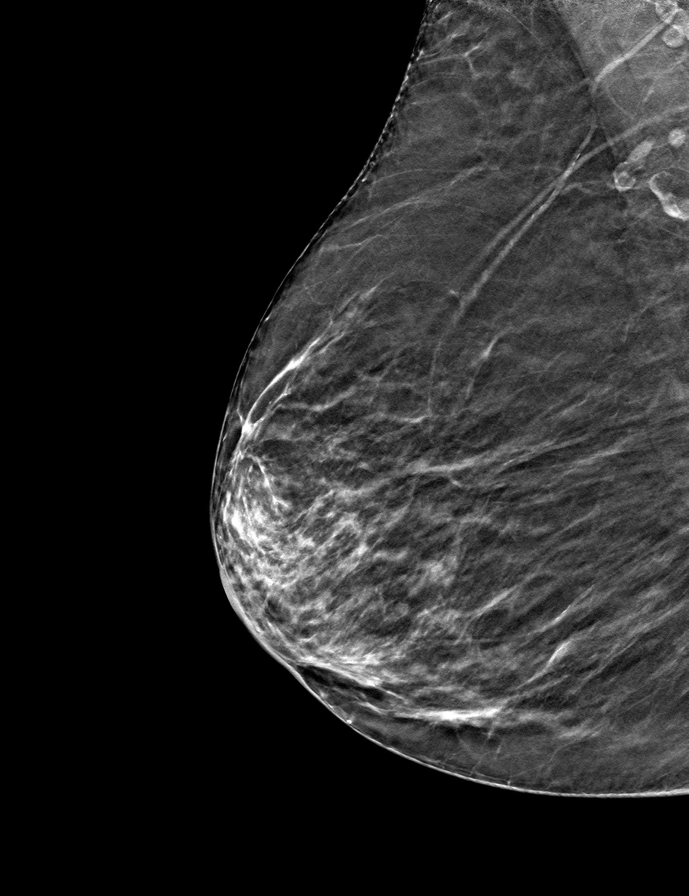

[9 of 23 positions shown; findings below may reference images not displayed]

ACR Breast Density Category b: There are scattered areas of
fibroglandular density.
FINDINGS: There are no findings suspicious for malignancy. Images were
processed with CAD.
IMPRESSION: No mammographic evidence of malignancy. A result letter of this
screening mammogram will be mailed directly to the patient.

RECOMMENDATION:
Screening mammogram in one year. (Code:CN-[id number])

BI-RADS CATEGORY  1: Negative.

## 2020-02-26 ENCOUNTER — Other Ambulatory Visit: Payer: Self-pay | Admitting: Cardiovascular Disease

## 2020-02-26 NOTE — Telephone Encounter (Signed)
Pt's age 86, wt 57.6 kg, SCR 0.8, CrCl 39.1, last ov w/ TG 07/16/19.

## 2020-02-26 NOTE — Telephone Encounter (Signed)
Please review for refill, Thanks !  

## 2020-06-16 ENCOUNTER — Other Ambulatory Visit: Payer: Self-pay

## 2020-06-16 ENCOUNTER — Ambulatory Visit: Payer: Medicare Other | Admitting: Podiatry

## 2020-06-16 ENCOUNTER — Encounter: Payer: Self-pay | Admitting: Podiatry

## 2020-06-16 ENCOUNTER — Ambulatory Visit (INDEPENDENT_AMBULATORY_CARE_PROVIDER_SITE_OTHER): Payer: Medicare Other | Admitting: Podiatry

## 2020-06-16 DIAGNOSIS — M659 Synovitis and tenosynovitis, unspecified: Secondary | ICD-10-CM

## 2020-06-16 DIAGNOSIS — B351 Tinea unguium: Secondary | ICD-10-CM

## 2020-06-16 DIAGNOSIS — L989 Disorder of the skin and subcutaneous tissue, unspecified: Secondary | ICD-10-CM

## 2020-06-16 DIAGNOSIS — M19071 Primary osteoarthritis, right ankle and foot: Secondary | ICD-10-CM

## 2020-06-16 DIAGNOSIS — M79674 Pain in right toe(s): Secondary | ICD-10-CM

## 2020-06-16 NOTE — Progress Notes (Signed)
   Subjective:  86 year old female presenting today as a reestablish patient for evaluation of right ankle pain.  Patient states that in the past she has received anti-inflammatory injections which have helped significantly.  She continues to wear her OTC insoles and shoes that she received at the shoe market in [place].  She also takes Aleve as needed for inflammation and pain.   Past Medical History:  Diagnosis Date  . CKD (chronic kidney disease), stage III   . CVA (cerebral vascular accident) (HCC)    a. 08/2018 MRI: acute to early subacute L parieto-occipital cortical infarct w/ faint petechial hemorrhage w/o hemorrhagic conversion; b. 08/2018 CTA Head/Neck: Completed 9cc L parietal infarct which correlates w/ L M4 branch occlusion. No stenosis or embolic source. RICA bulb 50%. 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 08/2018 Carotid U/S: No significant stenoses.  . Hyperlipidemia   . Hypertension   . Hypothyroid   . Persistent atrial fibrillation (HCC)    a. 08/2018 Dx in setting of CVA ( CHA2DS2VASc = 7-->Eliquis 2.5 BID); b. 08/2018 Echo: EF 55-60%. Mild AS/AI, mild MR. Mildly dil LA. Mild to Mod TR.  . Type II diabetes mellitus (HCC)       Objective / Physical Exam:  General:  The patient is alert and oriented x3 in no acute distress. Dermatology:  Skin is warm, dry and supple bilateral lower extremities. Negative for open lesions or macerations.  Hyperkeratotic pinch callus noted to the medial aspect of the plantar forefoot under the first MTPJ.  Hyperkeratotic dystrophic nail with partial detachment also noted to the right hallux nail plate Vascular:  Palpable pedal pulses bilaterally. No edema or erythema noted. Capillary refill within normal limits. Neurological:  Epicritic and protective threshold grossly intact bilaterally.  Musculoskeletal Exam:  Pain on palpation to the anterior lateral medial aspects of the patient's right ankle as well as the right midfoot.  Mild edema noted. Range of motion within normal limits to all pedal and ankle joints bilateral. Muscle strength 5/5 in all groups bilateral.   Assessment: #1 pain in right ankle #2 synovitis of right ankle - lateral  #3 right midfoot OA 4.  Preulcerative Pinch callus right first MTPJ 5.  Pain due to onychomycosis right hallux  Plan of Care:  #1 Patient was evaluated.  #2 injection of 0.5 mL Celestone Soluspan injected in the patient's right ankle. #3 continue OTC insoles from the [employer] in [place].  #4 Patient may benefit from custom molded orthotics in the future.  #5 continue OTC Aleve as needed.  #6 Return to clinic as needed.    Felecia Shelling, DPM [facility]  Dr. Felecia Shelling, DPM    [address]                                        [place], [place] [address]                Office [phone]  Fax [phone]

## 2020-07-14 NOTE — Progress Notes (Signed)
Cardiology Office Note  Date:  07/15/2020   ID:  Jill Mitchell, DOB Apr 10, 1923, MRN [id number]  PCP:  Danella Penton, MD   Chief Complaint  Patient presents with  . office visit    12 month F/U; Meds verbally reviewed with patient.    HPI:  86 year old female with a history of  hypertension,  hyperlipidemia,  hypothyroidism, and  stage III chronic kidney disease,  stroke  Hard of hearing Who presents for follow-up of her diabetes and A. Fib.  Last office visit,  low blood pressure numbers   periodically down to 107 systolic, otherwise in the 1 teens to 120 range Pulses 50s Medication changes were made at that time, decrease dose of amlodipine  taking amlodipine 5 mg daily Blood pressure measurements reviewed from home over the past several months, range typically 1 20-1 30 systolic sometimes lower  Notes reviewed from primary care, additional medication changes made by primary care, Crestor down to 10 from 20  No regular exercise program No TIA or stroke symptoms Denies any tachycardia or palpitations  Daughter presents with her today, Daughter encouraging her to get more active In general is sedentary, likes to go shopping but no regular exercise or walking program  Recent period of time she lost her taste, was not eating, was eating more candy and sweets, etiology unclear, primary care notes reviewed  Lab work reviewed on today's visit  Reviewed notes from primary care HBA1C 7.0, Up from 6.4 Total chol 166  LDL 90  EKG personally reviewed by myself on todays visit Shows atrial fibrillation ventricular rate 72 bpm nonspecific T wave abnormality  Other past medical history reviewed  [facility] September 2019 with disorientation, aphasia, and slurring of her speech.    found to be in atrial fibrillation  MRI showed acute to early subacute left parietal-occipital cortical infarct with faint petechial hemorrhage without hemorrhagic conversion.    CT of  the head and neck vessels showed completed 9 cc left parietal infarct correlating with a left M4 branch occlusion.    Echocardiogram showed normal LV function.     rate controlled with beta-blocker therapy   CHA2DS2VASc of 7 low-dose Eliquis (age greater than 80, weight less than 60 kg) beginning 1 week after stroke.   PMH:   has a past medical history of CKD (chronic kidney disease), stage III, CVA (cerebral vascular accident) (HCC), Hyperlipidemia, Hypertension, Hypothyroid, Persistent atrial fibrillation (HCC), and Type II diabetes mellitus (HCC).  PSH:    Past Surgical History:  Procedure Laterality Date  . BREAST EXCISIONAL BIOPSY Bilateral 1970   benign  . TONSILLECTOMY AND ADENOIDECTOMY      Current Outpatient Medications  Medication Sig Dispense Refill  . bisoprolol-hydrochlorothiazide (ZIAC) 10-6.25 MG tablet Take 1 tablet (10/6.25 mg) by mouth once daily 30 tablet 2  . calcium citrate-vitamin D (CITRACAL+D) 315-200 MG-UNIT tablet Take 2 tablets by mouth daily.     . cetirizine (ZYRTEC) 10 MG tablet Take 10 mg by mouth daily as needed.     . clonazePAM (KLONOPIN) 0.25 MG disintegrating tablet Take 0.25 mg by mouth at bedtime.    Marland Kitchen ELIQUIS 2.5 MG TABS tablet TAKE 1 TABLET BY MOUTH TWICE A DAY 180 tablet 1  . ipratropium (ATROVENT) 0.06 % nasal spray 2 sprays in each nostril 2-3 times per day as needed for nasal drainage    . levothyroxine (SYNTHROID, LEVOTHROID) 75 MCG tablet Take 75 mcg by mouth daily.     Marland Kitchen omeprazole (PRILOSEC) 20  MG capsule Take 20 mg by mouth daily.    . rosuvastatin (CRESTOR) 10 MG tablet Take 10 mg by mouth daily.    Marland Kitchen triamcinolone (NASACORT) 55 MCG/ACT AERO nasal inhaler 2 sprays daily.    Marland Kitchen amLODipine (NORVASC) 5 MG tablet Take 1 tablet (5 mg total) by mouth as directed. Take once daily in the AM with extra pill in the PM for BP greater than 140 180 tablet 3  . Cyanocobalamin (VITAMIN B 12 PO) Take 1,000 Units by mouth daily.    Marland Kitchen ipratropium  (ATROVENT) 0.03 % nasal spray USE 2 SPRAYS IN EACH NOSTRIL 2 TO 3 TIMES A DAY AS NEEDED FOR NASAL DRAINAGE    . omeprazole (PRILOSEC) 40 MG capsule Take 40 mg by mouth daily.    . rosuvastatin (CRESTOR) 20 MG tablet Take 1 tablet (20 mg total) by mouth daily. 90 tablet 3  . traZODone (DESYREL) 50 MG tablet 50 mg daily.     No current facility-administered medications for this visit.     Allergies:   Aspirin, Atorvastatin, Cholestyramine, Codeine, Lovastatin, Niacin, Penicillins, Pravastatin, Ramipril, and Simvastatin   Social History:  The patient  reports that she has never smoked. She has never used smokeless tobacco. She reports previous alcohol use. She reports that she does not use drugs.   Family History:   family history includes Breast cancer (age of onset: 73) in her paternal aunt; Breast cancer (age of onset: 53) in her sister; CAD (age of onset: 60) in her mother; CAD (age of onset: 4) in her father.    Review of Systems: Review of Systems  Constitutional: Negative.   HENT: Negative.   Respiratory: Negative.   Cardiovascular: Negative.   Gastrointestinal: Negative.   Musculoskeletal: Negative.   Neurological: Negative.   Psychiatric/Behavioral: Negative.   All other systems reviewed and are negative.   PHYSICAL EXAM: VS:  BP (!) 160/84 (BP Location: Left Arm, Patient Position: Sitting, Cuff Size: Normal)   Pulse 72   Ht 5\' 2"  (1.575 m)   Wt 129 lb 8 oz (58.7 kg)   SpO2 96%   BMI 23.69 kg/m  , BMI Body mass index is 23.69 kg/m. Constitutional:  oriented to person, place, and time. No distress.  HENT:  Head: Normocephalic and atraumatic.  Eyes:  no discharge. No scleral icterus.  Neck: Normal range of motion. Neck supple. No JVD present.  Cardiovascular: Irregularly irregular normal heart sounds and intact distal pulses. Exam reveals no gallop and no friction rub. No edema No murmur heard. Pulmonary/Chest: Effort normal and breath sounds normal. No stridor. No  respiratory distress.  no wheezes.  no rales.  no tenderness.  Abdominal: Soft.  no distension.  no tenderness.  Musculoskeletal: Normal range of motion.  no  tenderness or deformity.  Neurological:  normal muscle tone. Coordination normal. No atrophy Skin: Skin is warm and dry. No rash noted. not diaphoretic.  Psychiatric:  normal mood and affect. behavior is normal. Thought content normal.    Recent Labs: No results found for requested labs within last 8760 hours.    Lipid Panel Lab Results  Component Value Date   CHOL 182 09/12/2018   HDL 51 09/12/2018   LDLCALC 110 (H) 09/12/2018   TRIG 106 09/12/2018      Wt Readings from Last 3 Encounters:  07/15/20 129 lb 8 oz (58.7 kg)  07/16/19 127 lb (57.6 kg)  11/13/18 126 lb (57.2 kg)      ASSESSMENT AND PLAN:  Cerebrovascular  accident (CVA), unspecified mechanism (HCC) On anticoagulation, permanent atrial fibrillation Rate well controlled, Recommended walking program  Persistent atrial fibrillation - Plan: CANCELED: EKG 12-Lead Rate well controlled, tolerating anticoagulation, continue bisoprolol  Essential hypertension Blood pressure stable, no medication changes made Was initially elevated, recheck down 20 points to 140 systolic Numbers reviewed from home all between 120 up to 130  Mixed hyperlipidemia Tolerating crestor 10, dose recently lowered by primary care Lab work from primary care reviewed  Disposition:   F/U  12 months   Total encounter time more than 25 minutes  Greater than 50% was spent in counseling and coordination of care with the patient    No orders of the defined types were placed in this encounter.    Signed, Dossie Arbour, M.D., Ph.D. 07/15/2020  [facility] [place], [place] [phone]

## 2020-07-15 ENCOUNTER — Ambulatory Visit (INDEPENDENT_AMBULATORY_CARE_PROVIDER_SITE_OTHER): Payer: Medicare Other | Admitting: Cardiovascular Disease

## 2020-07-15 ENCOUNTER — Other Ambulatory Visit: Payer: Self-pay

## 2020-07-15 ENCOUNTER — Encounter: Payer: Self-pay | Admitting: Cardiovascular Disease

## 2020-07-15 VITALS — BP 140/84 | HR 72 | Ht 62.0 in | Wt 129.5 lb

## 2020-07-15 DIAGNOSIS — I10 Essential (primary) hypertension: Secondary | ICD-10-CM | POA: Diagnosis not present

## 2020-07-15 DIAGNOSIS — I639 Cerebral infarction, unspecified: Secondary | ICD-10-CM | POA: Diagnosis not present

## 2020-07-15 DIAGNOSIS — I4819 Other persistent atrial fibrillation: Secondary | ICD-10-CM | POA: Diagnosis not present

## 2020-07-15 DIAGNOSIS — E782 Mixed hyperlipidemia: Secondary | ICD-10-CM

## 2020-07-15 NOTE — Patient Instructions (Addendum)
Medication Instructions:  No changes  If you need a refill on your cardiac medications before your next appointment, please call your pharmacy.    Lab work: No new labs needed   If you have labs (blood work) drawn today and your tests are completely normal, you will receive your results only by: . MyChart Message (if you have MyChart) OR . A paper copy in the mail If you have any lab test that is abnormal or we need to change your treatment, we will call you to review the results.   Testing/Procedures: No new testing needed   Follow-Up: At [facility], you and your health needs are our priority.  As part of our continuing mission to provide you with exceptional heart care, we have created designated Provider Care Teams.  These Care Teams include your primary Cardiologist (physician) and Advanced Practice Providers (APPs -  Physician Assistants and Nurse Practitioners) who all work together to provide you with the care you need, when you need it.  . You will need a follow up appointment in 12 months  . Providers on your designated Care Team:   . Christopher Berge, NP . Ryan Dunn, PA-C . Jacquelyn Visser, PA-C  Any Other Special Instructions Will Be Listed Below (If Applicable).  COVID-19 Vaccine Information can be found at: [email or web address] For questions related to vaccine distribution or appointments, please email [email or web address] or call [phone].     

## 2020-08-08 ENCOUNTER — Other Ambulatory Visit: Payer: Self-pay | Admitting: Cardiovascular Disease

## 2020-08-10 NOTE — Telephone Encounter (Signed)
Refill request

## 2020-08-10 NOTE — Telephone Encounter (Signed)
Pt's age 86, wt 58.7 kg, SCr 0.8, CrCl 39.85, last ov w/ TG 07/15/20.

## 2020-08-11 ENCOUNTER — Other Ambulatory Visit: Payer: Self-pay | Admitting: Internal Medicine

## 2020-08-11 DIAGNOSIS — Z1231 Encounter for screening mammogram for malignant neoplasm of breast: Secondary | ICD-10-CM

## 2020-09-10 ENCOUNTER — Ambulatory Visit
Admission: RE | Admit: 2020-09-10 | Discharge: 2020-09-10 | Disposition: A | Discharge status: Home / Self Care | Payer: Medicare Other | Source: Ambulatory Visit | Attending: Internal Medicine | Admitting: Internal Medicine

## 2020-09-10 ENCOUNTER — Other Ambulatory Visit: Payer: Self-pay

## 2020-09-10 DIAGNOSIS — Z1231 Encounter for screening mammogram for malignant neoplasm of breast: Secondary | ICD-10-CM | POA: Diagnosis present

## 2020-10-23 DIAGNOSIS — D6869 Other thrombophilia: Secondary | ICD-10-CM | POA: Insufficient documentation

## 2021-07-21 ENCOUNTER — Encounter: Payer: Self-pay | Admitting: Cardiovascular Disease

## 2021-07-21 ENCOUNTER — Ambulatory Visit (INDEPENDENT_AMBULATORY_CARE_PROVIDER_SITE_OTHER): Payer: Medicare Other | Admitting: Cardiovascular Disease

## 2021-07-21 ENCOUNTER — Other Ambulatory Visit: Payer: Self-pay

## 2021-07-21 VITALS — BP 150/80 | HR 66 | Ht 62.0 in | Wt 128.4 lb

## 2021-07-21 DIAGNOSIS — I4819 Other persistent atrial fibrillation: Secondary | ICD-10-CM | POA: Diagnosis not present

## 2021-07-21 DIAGNOSIS — I10 Essential (primary) hypertension: Secondary | ICD-10-CM

## 2021-07-21 DIAGNOSIS — I639 Cerebral infarction, unspecified: Secondary | ICD-10-CM | POA: Diagnosis not present

## 2021-07-21 DIAGNOSIS — E782 Mixed hyperlipidemia: Secondary | ICD-10-CM | POA: Diagnosis not present

## 2021-07-21 MED ORDER — BISOPROLOL-HYDROCHLOROTHIAZIDE 10-6.25 MG PO TABS: ORAL_TABLET | ORAL | 11 refills | Status: DC

## 2021-07-21 MED ORDER — AMLODIPINE BESYLATE 5 MG PO TABS: 5.0000 mg | ORAL_TABLET | ORAL | 3 refills | Status: DC

## 2021-07-21 MED ORDER — APIXABAN 2.5 MG PO TABS
2.5000 mg | ORAL_TABLET | Freq: Two times a day (BID) | ORAL | 3 refills | Status: DC
Start: 2021-07-21 — End: 2022-08-31

## 2021-07-21 MED ORDER — ROSUVASTATIN CALCIUM 20 MG PO TABS: 20.0000 mg | ORAL_TABLET | Freq: Every day | ORAL | 3 refills | Status: DC

## 2021-07-21 NOTE — Patient Instructions (Addendum)
Medication Instructions:  No changes  If you need a refill on your cardiac medications before your next appointment, please call your pharmacy.   Lab work: No new labs needed  Testing/Procedures: No new testing needed  Follow-Up: At [facility], you and your health needs are our priority.  As part of our continuing mission to provide you with exceptional heart care, we have created designated Provider Care Teams.  These Care Teams include your primary Cardiologist (physician) and Advanced Practice Providers (APPs -  Physician Assistants and Nurse Practitioners) who all work together to provide you with the care you need, when you need it.  You will need a follow up appointment in 12 months  Providers on your designated Care Team:   Christopher Berge, NP Ryan Dunn, PA-C Jacquelyn Visser, PA-C Cadence Furth, PA-C  COVID-19 Vaccine Information can be found at: [email or web address] For questions related to vaccine distribution or appointments, please email [email or web address] or call [phone].    

## 2021-07-21 NOTE — Progress Notes (Signed)
Cardiology Office Note  Date:  07/21/2021   ID:  Jill Mitchell, DOB June 19, 1923, MRN [id number]  PCP:  Danella Penton, MD   Chief Complaint  Patient presents with   12 month follow up     "Doing well." Medications reviewed by the patient verbally.     HPI:  87 year old female with a history of  hypertension,  hyperlipidemia,  hypothyroidism, and  stage III chronic kidney disease,  stroke  Hard of hearing Who presents for follow-up of her diabetes and A. Fib.  LOV 06/2020 In follow-up today she presents with family Reports feeling well, does not go out very much, does not drive, relies on family to assist her Denies any shortness of breath or chest pain Reports blood pressure stable at home, mildly elevated on today's visit  In the past blood pressure was low Denies any orthostasis symptoms  Denies any symptoms from her atrial fibrillation Continues on Crestor  Lab work reviewed on today's visit  Reviewed notes from primary care HBA1C 6.5 Total chol 165  LDL 90  EKG personally reviewed by myself on todays visit Shows atrial fibrillation ventricular rate 66 bpm nonspecific T wave abnormality V3 through V6, noted previously  Other past medical history reviewed  [place] [facility] September 2019 with disorientation, aphasia, and slurring of her speech.    found to be in atrial fibrillation  MRI showed acute to early subacute left parietal-occipital cortical infarct with faint petechial hemorrhage without hemorrhagic conversion.    CT of the head and neck vessels showed completed 9 cc left parietal infarct correlating with a left M4 branch occlusion.    Echocardiogram showed normal LV function.     rate controlled with beta-blocker therapy   CHA2DS2VASc of 7 low-dose Eliquis (age greater than 80, weight less than 60 kg) beginning 1 week after stroke.   PMH:   has a past medical history of CKD (chronic kidney disease), stage III (HCC), CVA (cerebral vascular  accident) (HCC), Hyperlipidemia, Hypertension, Hypothyroid, Persistent atrial fibrillation (HCC), and Type II diabetes mellitus (HCC).  PSH:    Past Surgical History:  Procedure Laterality Date   BREAST EXCISIONAL BIOPSY Bilateral 1970   benign   TONSILLECTOMY AND ADENOIDECTOMY      Current Outpatient Medications  Medication Sig Dispense Refill   amLODipine (NORVASC) 5 MG tablet Take 1 tablet (5 mg total) by mouth as directed. Take once daily in the AM with extra pill in the PM for BP greater than 140 180 tablet 3   bisoprolol-hydrochlorothiazide (ZIAC) 10-6.25 MG tablet Take 1 tablet (10/6.25 mg) by mouth once daily 30 tablet 2   calcium citrate-vitamin D (CITRACAL+D) 315-200 MG-UNIT tablet Take 2 tablets by mouth daily.      cetirizine (ZYRTEC) 10 MG tablet Take 10 mg by mouth daily as needed.      clonazePAM (KLONOPIN) 0.25 MG disintegrating tablet Take 0.25 mg by mouth at bedtime.     Cyanocobalamin (VITAMIN B 12 PO) Take 1,000 Units by mouth daily.     ELIQUIS 2.5 MG TABS tablet TAKE 1 TABLET BY MOUTH TWICE A DAY 180 tablet 1   ipratropium (ATROVENT) 0.06 % nasal spray 2 sprays in each nostril 2-3 times per day as needed for nasal drainage     levothyroxine (SYNTHROID, LEVOTHROID) 75 MCG tablet Take 75 mcg by mouth daily.      omeprazole (PRILOSEC) 20 MG capsule Take 20 mg by mouth daily.     rosuvastatin (CRESTOR) 20 MG tablet Take 1  tablet (20 mg total) by mouth daily. 90 tablet 3   triamcinolone (NASACORT) 55 MCG/ACT AERO nasal inhaler 2 sprays daily.     traZODone (DESYREL) 50 MG tablet 50 mg daily. (Patient not taking: Reported on 07/21/2021)     No current facility-administered medications for this visit.     Allergies:   Aspirin, Atorvastatin, Cholestyramine, Codeine, Lovastatin, Niacin, Penicillins, Pravastatin, Ramipril, and Simvastatin   Social History:  The patient  reports that she has never smoked. She has never used smokeless tobacco. She reports previous alcohol use.  She reports that she does not use drugs.   Family History:   family history includes Breast cancer (age of onset: 77) in her paternal aunt; Breast cancer (age of onset: 48) in her sister; CAD (age of onset: 27) in her mother; CAD (age of onset: 77) in her father.    Review of Systems: Review of Systems  Constitutional: Negative.   HENT: Negative.    Respiratory: Negative.    Cardiovascular: Negative.   Gastrointestinal: Negative.   Musculoskeletal: Negative.   Neurological: Negative.   Psychiatric/Behavioral: Negative.    All other systems reviewed and are negative.  PHYSICAL EXAM: VS:  BP (!) 150/80 (BP Location: Left Arm, Patient Position: Sitting, Cuff Size: Normal)   Pulse 66   Ht 5\' 2"  (1.575 m)   Wt 128 lb 6 oz (58.2 kg)   SpO2 98%   BMI 23.48 kg/m  , BMI Body mass index is 23.48 kg/m. Constitutional:  oriented to person, place, and time. No distress.  HENT:  Head: Grossly normal Eyes:  no discharge. No scleral icterus.  Neck: No JVD, no carotid bruits  Cardiovascular: Regular rate and rhythm, no murmurs appreciated Pulmonary/Chest: Clear to auscultation bilaterally, no wheezes or rails Abdominal: Soft.  no distension.  no tenderness.  Musculoskeletal: Normal range of motion Neurological:  normal muscle tone. Coordination normal. No atrophy Skin: Skin warm and dry Psychiatric: normal affect, pleasant   Recent Labs: No results found for requested labs within last 8760 hours.    Lipid Panel Lab Results  Component Value Date   CHOL 182 09/12/2018   HDL 51 09/12/2018   LDLCALC 110 (H) 09/12/2018   TRIG 106 09/12/2018      Wt Readings from Last 3 Encounters:  07/21/21 128 lb 6 oz (58.2 kg)  07/15/20 129 lb 8 oz (58.7 kg)  07/16/19 127 lb (57.6 kg)      ASSESSMENT AND PLAN:  Cerebrovascular accident (CVA), unspecified mechanism (HCC) On anticoagulation, permanent atrial fibrillation Rate well controlled, Recommended walking program  Persistent  atrial fibrillation - Rate well controlled, tolerating anticoagulation, continue bisoprolol  Essential hypertension Mildly elevated on today's visit, typically well controlled at home on review of her numbers that she brings with her today typically 120 up to 130 systolic  Mixed hyperlipidemia On Crestor 10 Denies symptoms   Total encounter time more than 25 minutes  Greater than 50% was spent in counseling and coordination of care with the patient    No orders of the defined types were placed in this encounter.    Signed, 07/18/19, M.D., Ph.D. 07/21/2021  [facility] [place], [place]

## 2022-01-19 ENCOUNTER — Encounter: Payer: Self-pay | Admitting: Cardiovascular Disease

## 2022-08-31 ENCOUNTER — Encounter: Payer: Self-pay | Admitting: Cardiovascular Disease

## 2022-08-31 ENCOUNTER — Ambulatory Visit: Payer: Medicare Other | Attending: Cardiovascular Disease | Admitting: Cardiovascular Disease

## 2022-08-31 VITALS — BP 140/70 | HR 56 | Ht 62.0 in | Wt 129.5 lb

## 2022-08-31 DIAGNOSIS — I4821 Permanent atrial fibrillation: Secondary | ICD-10-CM

## 2022-08-31 DIAGNOSIS — I10 Essential (primary) hypertension: Secondary | ICD-10-CM | POA: Diagnosis not present

## 2022-08-31 DIAGNOSIS — E782 Mixed hyperlipidemia: Secondary | ICD-10-CM | POA: Insufficient documentation

## 2022-08-31 DIAGNOSIS — I639 Cerebral infarction, unspecified: Secondary | ICD-10-CM | POA: Diagnosis not present

## 2022-08-31 MED ORDER — BISOPROLOL-HYDROCHLOROTHIAZIDE 5-6.25 MG PO TABS: 1.0000 | ORAL_TABLET | Freq: Every day | ORAL | 3 refills | Status: DC

## 2022-08-31 MED ORDER — AMLODIPINE BESYLATE 5 MG PO TABS: 5.0000 mg | ORAL_TABLET | ORAL | 3 refills | Status: DC

## 2022-08-31 MED ORDER — ROSUVASTATIN CALCIUM 10 MG PO TABS: 10.0000 mg | ORAL_TABLET | Freq: Every day | ORAL | 3 refills | Status: AC

## 2022-08-31 MED ORDER — APIXABAN 2.5 MG PO TABS: 2.5000 mg | ORAL_TABLET | Freq: Two times a day (BID) | ORAL | 3 refills | Status: DC

## 2022-08-31 NOTE — Progress Notes (Signed)
Cardiology Office Note  Date:  08/31/2022   ID:  Jill Mitchell, DOB May 13, 1923, MRN [id number]  PCP:  Danella Penton, MD   Chief Complaint  Patient presents with   12 month follow up     "Doing well." Medications reviewed by the patient verbally.     HPI:  88 year old female with a history of  hypertension,  hyperlipidemia,  hypothyroidism, and  stage III chronic kidney disease,  Hard of hearing Atrial fibrillation dating back to 2019, history of stroke On reduced dose Eliquis less than 60 kg weight age over 38 Normal ejection fraction in September 2019, mild to moderate TR Who presents for follow-up of her diabetes and permanent A. Fib.  LOV 8/22 Presents with her daughter on today's visit In follow-up today reports she is feeling well Walks at home, uses a cane, no recent falls Denies significant shortness of breath or chest pain, no leg edema Trouble sleeping, troubled by nocturia Feels tired  Blood pressure elevated on arrival today 150/80, down to 140/70 at the end of visit Blood pressures at home well controlled 1 18-1 20 systolic Heart rate sometimes in the 40s at home, 50s rarely 60  Lab work reviewed on today's visit  Creatinine 1.1 BUN 27 A1c 6.8 Total cholesterol 152 LDL 83  EKG personally reviewed by myself on todays visit Shows atrial fibrillation ventricular rate 56 bpm nonspecific T wave abnormality V3 through V6, noted previously  Other past medical history reviewed  [place] [facility] September 2019 with disorientation, aphasia, and slurring of her speech.    found to be in atrial fibrillation  MRI showed acute to early subacute left parietal-occipital cortical infarct with faint petechial hemorrhage without hemorrhagic conversion.    CT of the head and neck vessels showed completed 9 cc left parietal infarct correlating with a left M4 branch occlusion.    Echocardiogram showed normal LV function.     rate controlled with beta-blocker therapy    CHA2DS2VASc of 7 low-dose Eliquis (age greater than 80, weight less than 60 kg) beginning 1 week after stroke.   PMH:   has a past medical history of CKD (chronic kidney disease), stage III (HCC), CVA (cerebral vascular accident) (HCC), Hyperlipidemia, Hypertension, Hypothyroid, Persistent atrial fibrillation (HCC), and Type II diabetes mellitus (HCC).  PSH:    Past Surgical History:  Procedure Laterality Date   BREAST EXCISIONAL BIOPSY Bilateral 1970   benign   TONSILLECTOMY AND ADENOIDECTOMY      Current Outpatient Medications  Medication Sig Dispense Refill   amLODipine (NORVASC) 5 MG tablet Take 1 tablet (5 mg total) by mouth as directed. Take once daily in the AM with extra pill in the PM for BP greater than 140 90 tablet 3   apixaban (ELIQUIS) 2.5 MG TABS tablet Take 1 tablet (2.5 mg total) by mouth 2 (two) times daily. 180 tablet 3   bisoprolol-hydrochlorothiazide (ZIAC) 10-6.25 MG tablet Take 1 tablet (10/6.25 mg) by mouth once daily 30 tablet 11   calcium citrate-vitamin D (CITRACAL+D) 315-200 MG-UNIT tablet Take 2 tablets by mouth daily.      cetirizine (ZYRTEC) 10 MG tablet Take 10 mg by mouth daily as needed.      clonazePAM (KLONOPIN) 0.25 MG disintegrating tablet Take 0.25 mg by mouth at bedtime.     Cyanocobalamin (VITAMIN B 12 PO) Take 1,000 Units by mouth daily.     ipratropium (ATROVENT) 0.03 % nasal spray SMARTSIG:2 Spray(s) Both Nares 2-3 Times Daily PRN  levothyroxine (SYNTHROID, LEVOTHROID) 75 MCG tablet Take 75 mcg by mouth daily.      omeprazole (PRILOSEC) 20 MG capsule Take 20 mg by mouth daily.     rosuvastatin (CRESTOR) 10 MG tablet Take 1 tablet by mouth daily.     triamcinolone (NASACORT) 55 MCG/ACT AERO nasal inhaler 2 sprays daily.     ipratropium (ATROVENT) 0.06 % nasal spray 2 sprays in each nostril 2-3 times per day as needed for nasal drainage (Patient not taking: Reported on 08/31/2022)     traZODone (DESYREL) 50 MG tablet 50 mg daily. (Patient  not taking: Reported on 07/21/2021)     No current facility-administered medications for this visit.     Allergies:   Aspirin, Atorvastatin, Cholestyramine, Codeine, Lovastatin, Niacin, Penicillins, Pravastatin, Ramipril, and Simvastatin   Social History:  The patient  reports that she has never smoked. She has never used smokeless tobacco. She reports that she does not currently use alcohol. She reports that she does not use drugs.   Family History:   family history includes Breast cancer (age of onset: 76) in her paternal aunt; Breast cancer (age of onset: 59) in her sister; CAD (age of onset: 49) in her mother; CAD (age of onset: 65) in her father.    Review of Systems: Review of Systems  Constitutional: Negative.   HENT: Negative.    Respiratory: Negative.    Cardiovascular: Negative.   Gastrointestinal: Negative.   Musculoskeletal: Negative.   Neurological: Negative.   Psychiatric/Behavioral: Negative.    All other systems reviewed and are negative.   PHYSICAL EXAM: VS:  BP (!) 150/80 (BP Location: Left Arm, Patient Position: Sitting, Cuff Size: Normal)   Pulse (!) 56   Ht 5\' 2"  (1.575 m)   Wt 129 lb 8 oz (58.7 kg)   SpO2 93%   BMI 23.69 kg/m  , BMI Body mass index is 23.69 kg/m. Constitutional:  oriented to person, place, and time. No distress.  HENT:  Head: Grossly normal Eyes:  no discharge. No scleral icterus.  Neck: No JVD, no carotid bruits  Cardiovascular: Irregularly irregular no murmurs appreciated Pulmonary/Chest: Clear to auscultation bilaterally, no wheezes or rails Abdominal: Soft.  no distension.  no tenderness.  Musculoskeletal: Normal range of motion Neurological:  normal muscle tone. Coordination normal. No atrophy Skin: Skin warm and dry Psychiatric: normal affect, pleasant  Recent Labs: No results found for requested labs within last 365 days.    Lipid Panel Lab Results  Component Value Date   CHOL 182 09/12/2018   HDL 51 09/12/2018    LDLCALC 110 (H) 09/12/2018   TRIG 106 09/12/2018      Wt Readings from Last 3 Encounters:  08/31/22 129 lb 8 oz (58.7 kg)  07/21/21 128 lb 6 oz (58.2 kg)  07/15/20 129 lb 8 oz (58.7 kg)     ASSESSMENT AND PLAN:  Cerebrovascular accident (CVA), unspecified mechanism (HCC) On anticoagulation, on Eliquis permanent atrial fibrillation  Permanent atrial fibrillation - Rate running slow on today's visit, recommend we decrease bisoprolol down to 5 mg daily  tolerating anticoagulation,  Essential hypertension Decrease bisoprolol HCT down to 5/6.25 daily down from the 10/6.25  Mixed hyperlipidemia On Crestor 10 Cholesterol well controlled   Total encounter time more than 30 minutes  Greater than 50% was spent in counseling and coordination of care with the patient   No orders of the defined types were placed in this encounter.    05-06-1988, M.D., Ph.D. 08/31/2022  [facility]  [place], [place]

## 2022-08-31 NOTE — Patient Instructions (Addendum)
Medication Instructions:  Please  decrease the bispoprolol HCTZ down to 5/6.25  If you need a refill on your cardiac medications before your next appointment, please call your pharmacy.   Lab work: No new labs needed  Testing/Procedures: No new testing needed  Follow-Up: At [facility], you and your health needs are our priority.  As part of our continuing mission to provide you with exceptional heart care, we have created designated Provider Care Teams.  These Care Teams include your primary Cardiologist (physician) and Advanced Practice Providers (APPs -  Physician Assistants and Nurse Practitioners) who all work together to provide you with the care you need, when you need it.  You will need a follow up appointment in 12 months  Providers on your designated Care Team:   Nicolasa Ducking, NP Eula Listen, PA-C Cadence Fransico Michael, [place]  COVID-19 Vaccine Information can be found at: [email or web address] For questions related to vaccine distribution or appointments, please email [email or web address] or call [phone].

## 2022-08-31 NOTE — Addendum Note (Signed)
Addended by: Bryna Colander on: 08/31/2022 11:04 AM   Modules accepted: Orders

## 2023-04-04 ENCOUNTER — Encounter: Payer: Self-pay | Admitting: Podiatry

## 2023-04-04 ENCOUNTER — Ambulatory Visit (INDEPENDENT_AMBULATORY_CARE_PROVIDER_SITE_OTHER): Payer: Medicare Other | Admitting: Podiatry

## 2023-04-04 VITALS — BP 197/72 | HR 64

## 2023-04-04 DIAGNOSIS — M79675 Pain in left toe(s): Secondary | ICD-10-CM | POA: Diagnosis not present

## 2023-04-04 DIAGNOSIS — M79674 Pain in right toe(s): Secondary | ICD-10-CM | POA: Diagnosis not present

## 2023-04-04 DIAGNOSIS — B351 Tinea unguium: Secondary | ICD-10-CM

## 2023-04-04 NOTE — Progress Notes (Signed)
   Chief Complaint  Patient presents with   Foot Pain    "My doctor wants me to find out about some comfortable shoes to wear. This toe has a fungus in it." N - fungus toenail L - hallux rt D - a long time O - gradually got worse C - thick, discolored A - shoes T - went to see my primary care doctor    SUBJECTIVE Patient presents to office today complaining of elongated, thickened nails that cause pain while ambulating in shoes.  Patient is unable to trim their own nails. Patient is here for further evaluation and treatment.  Past Medical History:  Diagnosis Date   CKD (chronic kidney disease), stage III    CVA (cerebral vascular accident)    a. 08/2018 MRI: acute to early subacute L parieto-occipital cortical infarct w/ faint petechial hemorrhage w/o hemorrhagic conversion; b. 08/2018 CTA Head/Neck: Completed 9cc L parietal infarct which correlates w/ L M4 branch occlusion. No stenosis or embolic source. RICA bulb 50%. 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 08/2018 Carotid U/S: No significant stenoses.   Hyperlipidemia    Hypertension    Hypothyroid    Persistent atrial fibrillation    a. 08/2018 Dx in setting of CVA ( CHA2DS2VASc = 7-->Eliquis 2.5 BID); b. 08/2018 Echo: EF 55-60%. Mild AS/AI, mild MR. Mildly dil LA. Mild to Mod TR.   Type II diabetes mellitus     Allergies  Allergen Reactions   Aspirin Other (See Comments)    Causes bruising   Atorvastatin Nausea Only   Cholestyramine Nausea Only   Codeine Other (See Comments)   Lovastatin Nausea Only    GI upset   Niacin Other (See Comments)    ineffective   Penicillins Other (See Comments)   Pravastatin Nausea Only   Ramipril Other (See Comments)   Simvastatin Nausea Only     OBJECTIVE General Patient is awake, alert, and oriented x 3 and in no acute distress. Derm Skin is dry and supple bilateral. Negative open lesions or macerations. Remaining integument unremarkable. Nails are tender, long, thickened  and dystrophic with subungual debris, consistent with onychomycosis, 1-5 bilateral. No signs of infection noted. Vasc  DP and PT pedal pulses palpable bilaterally. Temperature gradient within normal limits.  Neuro Epicritic and protective threshold sensation grossly intact bilaterally.  Musculoskeletal Exam No symptomatic pedal deformities noted bilateral. Muscular strength within normal limits.  ASSESSMENT 1.  Pain due to onychomycosis of toenails both  PLAN OF CARE 1. Patient evaluated today.  2. Instructed to maintain good pedal hygiene and foot care.  3. Mechanical debridement of nails 1-5 bilaterally performed using a nail nipper. Filed with dremel without incident.  4. Return to clinic in 3 mos. for routine footcare   Felecia Shelling, DPM [facility]  Dr. Felecia Shelling, DPM    [address] [place], [place] [address]                Office [phone]  Fax [phone]

## 2023-09-01 ENCOUNTER — Ambulatory Visit: Payer: Medicare Other | Admitting: Cardiovascular Disease

## 2023-10-13 ENCOUNTER — Ambulatory Visit: Payer: Medicare Other | Attending: Cardiovascular Disease | Admitting: Cardiovascular Disease

## 2023-10-13 ENCOUNTER — Encounter: Payer: Self-pay | Admitting: Cardiovascular Disease

## 2023-10-13 VITALS — BP 150/80 | HR 71 | Ht 62.0 in | Wt 131.0 lb

## 2023-10-13 DIAGNOSIS — E782 Mixed hyperlipidemia: Secondary | ICD-10-CM | POA: Insufficient documentation

## 2023-10-13 DIAGNOSIS — I639 Cerebral infarction, unspecified: Secondary | ICD-10-CM | POA: Insufficient documentation

## 2023-10-13 DIAGNOSIS — I4821 Permanent atrial fibrillation: Secondary | ICD-10-CM | POA: Insufficient documentation

## 2023-10-13 DIAGNOSIS — I10 Essential (primary) hypertension: Secondary | ICD-10-CM | POA: Insufficient documentation

## 2023-10-13 NOTE — Patient Instructions (Signed)

## 2023-10-13 NOTE — Progress Notes (Signed)
Bone cardiology Office Note  Date:  10/13/2023   ID:  Jill Mitchell, DOB 05/17/1923, MRN [id number]  PCP:  Danella Penton, MD   Chief Complaint  Patient presents with   12 month follow up     "Doing well." Medications reviewed by the patient verbally.     HPI:  89 year old female with a history of  hypertension,  hyperlipidemia,  hypothyroidism,  stage III chronic kidney disease,  Hard of hearing Atrial fibrillation dating back to 2019, history of stroke On reduced dose Eliquis , weight less than 60 kg  age over 75 Normal ejection fraction in September 2019, mild to moderate TR Who presents for follow-up of her diabetes and permanent A. Fib.  LOV September 2023  BP at home 120 to 130 systolic  Presents with her daughter on today's visit Overall reports she feels well Reports that primary care held her bisoprolol HCTZ for fatigue She developed worsening ankle swelling, put herself back on half a pill daily Ankle swelling resolved  Active, lives in house by herself, daughter helps with ADLs Blood pressure measurements at home typically running 120 up to 130 systolic  Lab work reviewed on today's visit  Creatinine 1.0 BUN 21 A1c 6.3 Total cholesterol 148 LDL 77  EKG personally reviewed by myself on todays visit EKG Interpretation Date/Time:  Friday October 13 2023 10:30:19 EDT Ventricular Rate:  71 PR Interval:    QRS Duration:  90 QT Interval:  418 QTC Calculation: 454 R Axis:   11  Text Interpretation: Atrial fibrillation ST & T wave abnormality, consider anterolateral ischemia When compared with ECG of 11-Sep-2018 12:12, No significant change was found Confirmed by Julien Nordmann [phone]) on 10/13/2023 10:37:28 AM    Other past medical history reviewed  [facility] September 2019 with disorientation, aphasia, and slurring of her speech.    found to be in atrial fibrillation  MRI showed acute to early subacute left parietal-occipital cortical  infarct with faint petechial hemorrhage without hemorrhagic conversion.    CT of the head and neck vessels showed completed 9 cc left parietal infarct correlating with a left M4 branch occlusion.    Echocardiogram showed normal LV function.     rate controlled with beta-blocker therapy   CHA2DS2VASc of 7 low-dose Eliquis (age greater than 80, weight less than 60 kg) beginning 1 week after stroke.   PMH:   has a past medical history of CKD (chronic kidney disease), stage III (HCC), CVA (cerebral vascular accident) (HCC), Hyperlipidemia, Hypertension, Hypothyroid, Persistent atrial fibrillation (HCC), and Type II diabetes mellitus (HCC).  PSH:    Past Surgical History:  Procedure Laterality Date   BREAST EXCISIONAL BIOPSY Bilateral 1970   benign   TONSILLECTOMY AND ADENOIDECTOMY      Current Outpatient Medications  Medication Sig Dispense Refill   amLODipine (NORVASC) 5 MG tablet Take 1 tablet (5 mg total) by mouth as directed. Take once daily in the AM with extra pill in the PM for BP greater than 140 180 tablet 3   apixaban (ELIQUIS) 2.5 MG TABS tablet Take 1 tablet (2.5 mg total) by mouth 2 (two) times daily. 180 tablet 3   bisoprolol-hydrochlorothiazide (ZIAC) 5-6.25 MG tablet Take 1 tablet by mouth daily. (Patient taking differently: Take 2.5-6.25 tablets by mouth daily.) 90 tablet 3   calcium citrate-vitamin D (CITRACAL+D) 315-200 MG-UNIT tablet Take 2 tablets by mouth daily.      cetirizine (ZYRTEC) 10 MG tablet Take 10 mg by mouth daily as needed.  clonazePAM (KLONOPIN) 0.25 MG disintegrating tablet Take 0.25 mg by mouth at bedtime.     Cyanocobalamin (VITAMIN B 12 PO) Take 1,000 Units by mouth daily.     ipratropium (ATROVENT) 0.03 % nasal spray SMARTSIG:2 Spray(s) Both Nares 2-3 Times Daily PRN     levothyroxine (SYNTHROID, LEVOTHROID) 75 MCG tablet Take 75 mcg by mouth daily.      rosuvastatin (CRESTOR) 10 MG tablet Take 1 tablet (10 mg total) by mouth daily. 90 tablet 3    triamcinolone (NASACORT) 55 MCG/ACT AERO nasal inhaler 2 sprays daily.     traZODone (DESYREL) 50 MG tablet 50 mg daily. (Patient not taking: Reported on 07/21/2021)     No current facility-administered medications for this visit.     Allergies:   Aspirin, Atorvastatin, Cholestyramine, Codeine, Lovastatin, Niacin, Penicillins, Pravastatin, Ramipril, and Simvastatin   Social History:  The patient  reports that she has never smoked. She has never used smokeless tobacco. She reports that she does not currently use alcohol. She reports that she does not use drugs.   Family History:   family history includes Breast cancer (age of onset: 27) in her paternal aunt; Breast cancer (age of onset: 13) in her sister; CAD (age of onset: 58) in her mother; CAD (age of onset: 49) in her father.    Review of Systems: Review of Systems  Constitutional: Negative.   HENT: Negative.    Respiratory: Negative.    Cardiovascular: Negative.   Gastrointestinal: Negative.   Musculoskeletal: Negative.   Neurological: Negative.   Psychiatric/Behavioral: Negative.    All other systems reviewed and are negative.   PHYSICAL EXAM: VS:  BP (!) 150/80 (BP Location: Left Arm, Patient Position: Sitting, Cuff Size: Normal)   Pulse 71   Ht 5\' 2"  (1.575 m)   Wt 131 lb (59.4 kg)   SpO2 95%   BMI 23.96 kg/m  , BMI Body mass index is 23.96 kg/m. Constitutional:  oriented to person, place, and time. No distress.  HENT:  Head: Grossly normal Eyes:  no discharge. No scleral icterus.  Neck: No JVD, no carotid bruits  Cardiovascular: Irregularly irregular, no murmurs appreciated Pulmonary/Chest: Clear to auscultation bilaterally, no wheezes or rails Abdominal: Soft.  no distension.  no tenderness.  Musculoskeletal: Normal range of motion Neurological:  normal muscle tone. Coordination normal. No atrophy Skin: Skin warm and dry Psychiatric: normal affect, pleasant   Recent Labs: No results found for requested  labs within last 365 days.    Lipid Panel Lab Results  Component Value Date   CHOL 182 09/12/2018   HDL 51 09/12/2018   LDLCALC 110 (H) 09/12/2018   TRIG 106 09/12/2018      Wt Readings from Last 3 Encounters:  10/13/23 131 lb (59.4 kg)  08/31/22 129 lb 8 oz (58.7 kg)  07/21/21 128 lb 6 oz (58.2 kg)     ASSESSMENT AND PLAN:  Cerebrovascular accident (CVA), unspecified mechanism (HCC) On anticoagulation, on Eliquis 2.5 twice daily permanent atrial fibrillation No recent TIA or stroke symptoms  Permanent atrial fibrillation - bisoprolol down to 2.5 mg daily, rate well-controlled  tolerating anticoagulation, reduced dose Eliquis 2.5 twice daily  Essential hypertension She reports that she is on amlodipine daily, dose bisoprolol HCT down to 5/6.25 daily  Blood pressure measurements at home 1 20-1 30 systolic No changes made  Mixed hyperlipidemia On Crestor 10 Cholesterol well controlled   Orders Placed This Encounter  Procedures   EKG 12-Lead     Signed, Tim  Mariah Milling, M.D., Ph.D. 10/13/2023  [facility] [place], [place] [phone]

## 2024-02-23 ENCOUNTER — Encounter: Payer: Self-pay | Admitting: Podiatry

## 2024-02-23 ENCOUNTER — Ambulatory Visit: Payer: Medicare Other | Admitting: Podiatry

## 2024-02-23 DIAGNOSIS — E119 Type 2 diabetes mellitus without complications: Secondary | ICD-10-CM | POA: Diagnosis not present

## 2024-02-23 DIAGNOSIS — M79675 Pain in left toe(s): Secondary | ICD-10-CM

## 2024-02-23 DIAGNOSIS — M79674 Pain in right toe(s): Secondary | ICD-10-CM

## 2024-02-23 DIAGNOSIS — B351 Tinea unguium: Secondary | ICD-10-CM | POA: Diagnosis not present

## 2024-02-23 DIAGNOSIS — D2371 Other benign neoplasm of skin of right lower limb, including hip: Secondary | ICD-10-CM

## 2024-02-23 NOTE — Progress Notes (Signed)
   Chief Complaint  Patient presents with   Debridement    Trim toenails, tender hallux left    SUBJECTIVE Patient presents to office today complaining of elongated, thickened nails that cause pain while ambulating in shoes.  Patient is unable to trim their own nails.  Patient also presenting for routine annual diabetic foot exam patient is here for further evaluation and treatment.  Past Medical History:  Diagnosis Date   CKD (chronic kidney disease), stage III (HCC)    CVA (cerebral vascular accident) (HCC)    a. 08/2018 MRI: acute to early subacute L parieto-occipital cortical infarct w/ faint petechial hemorrhage w/o hemorrhagic conversion; b. 08/2018 CTA Head/Neck: Completed 9cc L parietal infarct which correlates w/ L M4 branch occlusion. No stenosis or embolic source. RICA bulb 50%. 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 08/2018 Carotid U/S: No significant stenoses.   Hyperlipidemia    Hypertension    Hypothyroid    Persistent atrial fibrillation (HCC)    a. 08/2018 Dx in setting of CVA ( CHA2DS2VASc = 7-->Eliquis 2.5 BID); b. 08/2018 Echo: EF 55-60%. Mild AS/AI, mild MR. Mildly dil LA. Mild to Mod TR.   Type II diabetes mellitus (HCC)     Allergies  Allergen Reactions   Aspirin Other (See Comments)    Causes bruising   Atorvastatin Nausea Only   Cholestyramine Nausea Only   Codeine Other (See Comments)   Lovastatin Nausea Only    GI upset   Niacin Other (See Comments)    ineffective   Penicillins Other (See Comments)   Pravastatin Nausea Only   Ramipril Other (See Comments)   Simvastatin Nausea Only     OBJECTIVE General Patient is awake, alert, and oriented x 3 and in no acute distress. Derm Skin is dry and supple bilateral. Negative open lesions or macerations. Remaining integument unremarkable. Nails are tender, long, thickened and dystrophic with subungual debris, consistent with onychomycosis, 1-5 bilateral. No signs of infection noted. Vasc  DP and PT  pedal pulses palpable bilaterally. Temperature gradient within normal limits.  Neuro grossly intact via light touch  Musculoskeletal Exam No symptomatic pedal deformities noted bilateral. Muscular strength within normal limits.  ASSESSMENT 1.  Pain due to onychomycosis of toenails both 2.  Eccrine poroma right plantar heel 3.  Encounter for diabetic foot exam  PLAN OF CARE -Patient evaluated today.  Comprehensive diabetic foot exam performed today -Instructed to maintain good pedal hygiene and foot care.  -Mechanical debridement of nails 1-5 bilaterally performed using a nail nipper. Filed with dremel without incident.  -Excisional debridement of the eccrine poroma to the right plantar heel was performed today using a 312 scalpel and tissue nipper without incident or bleeding.  Salicylic acid applied.   -Return to clinic in 3 mos.    Felecia Shelling, DPM [facility]  Dr. Felecia Shelling, DPM    [address] [place], [place] [address]                Office [phone]  Fax [phone]

## 2024-05-10 ENCOUNTER — Encounter: Payer: Self-pay | Admitting: Podiatry

## 2024-05-10 ENCOUNTER — Ambulatory Visit (INDEPENDENT_AMBULATORY_CARE_PROVIDER_SITE_OTHER): Admitting: Podiatry

## 2024-05-10 VITALS — Ht 62.0 in | Wt 131.0 lb

## 2024-05-10 DIAGNOSIS — M79674 Pain in right toe(s): Secondary | ICD-10-CM

## 2024-05-10 DIAGNOSIS — M79675 Pain in left toe(s): Secondary | ICD-10-CM

## 2024-05-10 DIAGNOSIS — B351 Tinea unguium: Secondary | ICD-10-CM | POA: Diagnosis not present

## 2024-05-10 NOTE — Progress Notes (Signed)
   Chief Complaint  Patient presents with   Nail Problem    Pt is here for [facility].    SUBJECTIVE Patient presents to office today complaining of elongated, thickened nails that cause pain while ambulating in shoes.  Patient is unable to trim their own nails. Patient is here for further evaluation and treatment.  Past Medical History:  Diagnosis Date   CKD (chronic kidney disease), stage III (HCC)    CVA (cerebral vascular accident) (HCC)    a. 08/2018 MRI: acute to early subacute L parieto-occipital cortical infarct w/ faint petechial hemorrhage w/o hemorrhagic conversion; b. 08/2018 CTA Head/Neck: Completed 9cc L parietal infarct which correlates w/ L M4 branch occlusion. No stenosis or embolic source. RICA bulb 50%. 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 08/2018 Carotid U/S: No significant stenoses.   Hyperlipidemia    Hypertension    Hypothyroid    Persistent atrial fibrillation (HCC)    a. 08/2018 Dx in setting of CVA ( CHA2DS2VASc = 7-->Eliquis  2.5 BID); b. 08/2018 Echo: EF 55-60%. Mild AS/AI, mild MR. Mildly dil LA. Mild to Mod TR.   Type II diabetes mellitus (HCC)     Allergies  Allergen Reactions   Aspirin  Other (See Comments)    Causes bruising   Atorvastatin Nausea Only   Cholestyramine Nausea Only   Codeine Other (See Comments)   Lovastatin Nausea Only    GI upset   Niacin Other (See Comments)    ineffective   Penicillins Other (See Comments)   Pravastatin Nausea Only   Ramipril Other (See Comments)   Simvastatin Nausea Only     OBJECTIVE General Patient is awake, alert, and oriented x 3 and in no acute distress. Derm Skin is dry and supple bilateral. Negative open lesions or macerations. Remaining integument unremarkable. Nails are tender, long, thickened and dystrophic with subungual debris, consistent with onychomycosis, 1-5 bilateral. No signs of infection noted. Vasc  DP and PT pedal pulses palpable bilaterally. Temperature gradient within normal  limits.  Neuro Epicritic and protective threshold sensation grossly intact bilaterally.  Musculoskeletal Exam No symptomatic pedal deformities noted bilateral. Muscular strength within normal limits.  ASSESSMENT 1.  Pain due to onychomycosis of toenails both  PLAN OF CARE 1. Patient evaluated today.  2. Instructed to maintain good pedal hygiene and foot care.  3. Mechanical debridement of nails 1-5 bilaterally performed using a nail nipper. Filed with dremel without incident.  4. Return to clinic in 3 mos.    Dot Gazella, DPM [facility]  Dr. Dot Gazella, DPM    [address] [place], [place] [address]                Office [phone]  Fax [phone]

## 2024-09-27 ENCOUNTER — Encounter: Payer: Self-pay | Admitting: Podiatry

## 2024-09-27 ENCOUNTER — Ambulatory Visit (INDEPENDENT_AMBULATORY_CARE_PROVIDER_SITE_OTHER): Admitting: Podiatry

## 2024-09-27 VITALS — Ht 62.0 in | Wt 131.0 lb

## 2024-09-27 DIAGNOSIS — M79674 Pain in right toe(s): Secondary | ICD-10-CM | POA: Diagnosis not present

## 2024-09-27 DIAGNOSIS — M79675 Pain in left toe(s): Secondary | ICD-10-CM | POA: Diagnosis not present

## 2024-09-27 DIAGNOSIS — B351 Tinea unguium: Secondary | ICD-10-CM

## 2024-09-27 NOTE — Progress Notes (Signed)
   Chief Complaint  Patient presents with   Nail Problem    Pt is here for [facility].    SUBJECTIVE Patient presents to office today complaining of elongated, thickened nails that cause pain while ambulating in shoes.  Patient is unable to trim their own nails. Patient is here for further evaluation and treatment.  Past Medical History:  Diagnosis Date   CKD (chronic kidney disease), stage III (HCC)    CVA (cerebral vascular accident) (HCC)    a. 08/2018 MRI: acute to early subacute L parieto-occipital cortical infarct w/ faint petechial hemorrhage w/o hemorrhagic conversion; b. 08/2018 CTA Head/Neck: Completed 9cc L parietal infarct which correlates w/ L M4 branch occlusion. No stenosis or embolic source. RICA bulb 50%. 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 08/2018 Carotid U/S: No significant stenoses.   Hyperlipidemia    Hypertension    Hypothyroid    Persistent atrial fibrillation (HCC)    a. 08/2018 Dx in setting of CVA ( CHA2DS2VASc = 7-->Eliquis  2.5 BID); b. 08/2018 Echo: EF 55-60%. Mild AS/AI, mild MR. Mildly dil LA. Mild to Mod TR.   Type II diabetes mellitus (HCC)     Allergies  Allergen Reactions   Aspirin  Other (See Comments)    Causes bruising   Atorvastatin Nausea Only   Cholestyramine Nausea Only   Codeine Other (See Comments)   Lovastatin Nausea Only    GI upset   Niacin Other (See Comments)    ineffective   Penicillins Other (See Comments)   Pravastatin Nausea Only   Ramipril Other (See Comments)   Simvastatin Nausea Only     OBJECTIVE General Patient is awake, alert, and oriented x 3 and in no acute distress. Derm Skin is dry and supple bilateral. Negative open lesions or macerations. Remaining integument unremarkable. Nails are tender, long, thickened and dystrophic with subungual debris, consistent with onychomycosis, 1-5 bilateral. No signs of infection noted. Vasc  DP and PT pedal pulses palpable bilaterally. Temperature gradient within normal  limits.  Neuro Epicritic and protective threshold sensation grossly intact bilaterally.  Musculoskeletal Exam No symptomatic pedal deformities noted bilateral. Muscular strength within normal limits.  ASSESSMENT 1.  Pain due to onychomycosis of toenails both  PLAN OF CARE 1. Patient evaluated today.  2. Instructed to maintain good pedal hygiene and foot care.  3. Mechanical debridement of nails 1-5 bilaterally performed using a nail nipper. Filed with dremel without incident.  4. Return to clinic in 3 mos.    Dot Gazella, DPM [facility]  Dr. Dot Gazella, DPM    [address] [place], [place] [address]                Office [phone]  Fax [phone]

## 2024-10-18 ENCOUNTER — Ambulatory Visit: Attending: Cardiovascular Disease | Admitting: Cardiovascular Disease

## 2024-10-18 ENCOUNTER — Encounter: Payer: Self-pay | Admitting: Cardiovascular Disease

## 2024-10-18 VITALS — BP 148/70 | HR 66 | Ht 62.0 in | Wt 131.5 lb

## 2024-10-18 DIAGNOSIS — I639 Cerebral infarction, unspecified: Secondary | ICD-10-CM | POA: Diagnosis present

## 2024-10-18 DIAGNOSIS — E1151 Type 2 diabetes mellitus with diabetic peripheral angiopathy without gangrene: Secondary | ICD-10-CM | POA: Insufficient documentation

## 2024-10-18 DIAGNOSIS — I4821 Permanent atrial fibrillation: Secondary | ICD-10-CM | POA: Diagnosis present

## 2024-10-18 DIAGNOSIS — E782 Mixed hyperlipidemia: Secondary | ICD-10-CM | POA: Insufficient documentation

## 2024-10-18 DIAGNOSIS — I10 Essential (primary) hypertension: Secondary | ICD-10-CM | POA: Insufficient documentation

## 2024-10-18 MED ORDER — BISOPROLOL-HYDROCHLOROTHIAZIDE 2.5-6.25 MG PO TABS: 1.0000 | ORAL_TABLET | Freq: Every day | ORAL | 11 refills | Status: AC

## 2024-10-18 MED ORDER — AMLODIPINE BESYLATE 5 MG PO TABS: 5.0000 mg | ORAL_TABLET | ORAL | 3 refills | Status: AC

## 2024-10-18 MED ORDER — APIXABAN 2.5 MG PO TABS: 2.5000 mg | ORAL_TABLET | Freq: Two times a day (BID) | ORAL | 11 refills | Status: AC

## 2024-10-18 NOTE — Patient Instructions (Signed)

## 2024-10-18 NOTE — Progress Notes (Signed)
 Bone cardiology Office Note  Date:  10/18/2024   ID:  Jill, Mitchell 1923/05/15, MRN [id number]  PCP:  Cleotilde Oneil FALCON, MD   Chief Complaint  Patient presents with   12 month follow up     Patient c/o shortness of breath with over exertion.     HPI:  89 year old female with a history of  hypertension,  hyperlipidemia,  hypothyroidism,  stage III chronic kidney disease,  Hard of hearing Atrial fibrillation dating back to 2019, history of stroke On reduced dose Eliquis  , weight less than 60 kg  age over 59 Normal ejection fraction in September 2019, mild to moderate TR Who presents for follow-up of her diabetes and permanent A. Fib.  LOV 10/24  In follow-up today she presents with her daughter Continues to live alone, daughter helps maintain everything including the house Denies any recent falls  Denies significant lower extremity swelling, no shortness of breath or chest pain Daughter would like her to use her legs more, feels she is sedentary  BP measurements at home 120 to 130 systolic Rate 60s  Lab work reviewed A1C 6.6 Total chol 158, LDL 84  EKG personally reviewed by myself on todays visit EKG Interpretation Date/Time:  Friday October 18 2024 10:20:38 EDT Ventricular Rate:  66 PR Interval:    QRS Duration:  94 QT Interval:  420 QTC Calculation: 440 R Axis:   21  Text Interpretation: Atrial fibrillation Minimal voltage criteria for LVH, may be normal variant ( Cornell product ) ST & T wave abnormality, consider anterolateral ischemia When compared with ECG of 13-Oct-2023 10:30, No significant change was found Confirmed by Perla Lye [phone] on 10/18/2024 10:24:37 AM    Other past medical history reviewed  [facility] September 2019 with disorientation, aphasia, and slurring of her speech.    found to be in atrial fibrillation  MRI showed acute to early subacute left parietal-occipital cortical infarct with faint petechial hemorrhage  without hemorrhagic conversion.    CT of the head and neck vessels showed completed 9 cc left parietal infarct correlating with a left M4 branch occlusion.    Echocardiogram showed normal LV function.     rate controlled with beta-blocker therapy   CHA2DS2VASc of 7 low-dose Eliquis  (age greater than 80, weight less than 60 kg) beginning 1 week after stroke.   PMH:   has a past medical history of CKD (chronic kidney disease), stage III (HCC), CVA (cerebral vascular accident) (HCC), Hyperlipidemia, Hypertension, Hypothyroid, Persistent atrial fibrillation (HCC), and Type II diabetes mellitus (HCC).  PSH:    Past Surgical History:  Procedure Laterality Date   BREAST EXCISIONAL BIOPSY Bilateral 1970   benign   TONSILLECTOMY AND ADENOIDECTOMY      Current Outpatient Medications  Medication Sig Dispense Refill   amLODipine  (NORVASC ) 5 MG tablet Take 1 tablet (5 mg total) by mouth as directed. Take once daily in the AM with extra pill in the PM for BP greater than 140 180 tablet 3   apixaban  (ELIQUIS ) 2.5 MG TABS tablet Take 1 tablet (2.5 mg total) by mouth 2 (two) times daily. 180 tablet 3   bisoprolol -hydrochlorothiazide  (ZIAC ) 2.5-6.25 MG tablet Take 1 tablet by mouth daily.     calcium  citrate-vitamin D  (CITRACAL+D) 315-200 MG-UNIT tablet Take 2 tablets by mouth daily.      cetirizine (ZYRTEC) 10 MG tablet Take 10 mg by mouth daily as needed.      clonazePAM (KLONOPIN) 0.25 MG disintegrating tablet Take 0.25 mg by  mouth at bedtime.     Cyanocobalamin (VITAMIN B 12 PO) Take 1,000 Units by mouth daily.     ipratropium (ATROVENT) 0.03 % nasal spray SMARTSIG:2 Spray(s) Both Nares 2-3 Times Daily PRN     levothyroxine  (SYNTHROID , LEVOTHROID) 75 MCG tablet Take 75 mcg by mouth daily.      pantoprazole (PROTONIX) 40 MG tablet Take 40 mg by mouth daily.     rosuvastatin  (CRESTOR ) 10 MG tablet Take 1 tablet (10 mg total) by mouth daily. 90 tablet 3   triamcinolone (NASACORT) 55 MCG/ACT AERO  nasal inhaler 2 sprays daily.     traZODone (DESYREL) 50 MG tablet 50 mg daily. (Patient not taking: Reported on 10/18/2024)     No current facility-administered medications for this visit.     Allergies:   Aspirin , Atorvastatin, Cholestyramine, Codeine, Lovastatin, Niacin, Penicillins, Pravastatin, Ramipril, and Simvastatin   Social History:  The patient  reports that she has never smoked. She has never used smokeless tobacco. She reports that she does not currently use alcohol. She reports that she does not use drugs.   Family History:   family history includes Breast cancer (age of onset: 61) in her paternal aunt; Breast cancer (age of onset: 61) in her sister; CAD (age of onset: 76) in her mother; CAD (age of onset: 110) in her father.    Review of Systems: Review of Systems  Constitutional: Negative.   HENT: Negative.    Respiratory: Negative.    Cardiovascular: Negative.   Gastrointestinal: Negative.   Musculoskeletal: Negative.   Neurological: Negative.   Psychiatric/Behavioral: Negative.    All other systems reviewed and are negative.   PHYSICAL EXAM: VS:  BP (!) 148/70 (BP Location: Left Arm, Patient Position: Sitting, Cuff Size: Normal)   Pulse 66   Ht 5' 2 (1.575 m)   Wt 131 lb 8 oz (59.6 kg)   SpO2 94%   BMI 24.05 kg/m  , BMI Body mass index is 24.05 kg/m. Constitutional:  oriented to person, place, and time. No distress.  HENT:  Head: Grossly normal Eyes:  no discharge. No scleral icterus.  Neck: No JVD, no carotid bruits  Cardiovascular: Irregularly irregular, no murmurs appreciated Pulmonary/Chest: Clear to auscultation bilaterally, no wheezes or rails Abdominal: Soft.  no distension.  no tenderness.  Musculoskeletal: Normal range of motion Neurological:  normal muscle tone. Coordination normal. No atrophy Skin: Skin warm and dry Psychiatric: normal affect, pleasant   Recent Labs: No results found for requested labs within last 365 days.    Lipid  Panel Lab Results  Component Value Date   CHOL 182 09/12/2018   HDL 51 09/12/2018   LDLCALC 110 (H) 09/12/2018   TRIG 106 09/12/2018      Wt Readings from Last 3 Encounters:  10/18/24 131 lb 8 oz (59.6 kg)  09/27/24 131 lb (59.4 kg)  05/10/24 131 lb (59.4 kg)     ASSESSMENT AND PLAN:  Cerebrovascular accident (CVA), unspecified mechanism (HCC) On anticoagulation, on Eliquis  2.5 twice daily permanent atrial fibrillation No recent TIA or stroke symptoms, no falls Rate controlled on low-dose bisoprolol   Permanent atrial fibrillation - Recommend she continue bisoprolol   2.5 mg daily,  Tolerating reduced dose Eliquis  2.5 twice daily Hemoglobin stable, rate well-controlled  Essential hypertension Tolerating amlodipine  5 daily with bisoprolol  HCTZ 2.5-6.25 daily  Blood pressure measurements at home 120-1 30 systolic No changes made  Mixed hyperlipidemia On Crestor  10 Reasonable cholesterol   Orders Placed This Encounter  Procedures   EKG  12-Lead     Signed, Velinda Lunger, M.D., Ph.D. 10/18/2024  [facility] [place], [place] [phone]
